# Patient Record
Sex: Male | Born: 1960 | Race: White | Hispanic: No | Smoking: Former smoker
Health system: Southern US, Community
[De-identification: ages and names within clinical notes are randomized; demographics above are authoritative.]

---

## 1999-03-21 ENCOUNTER — Emergency Department (HOSPITAL_COMMUNITY): Admission: EM | Admit: 1999-03-21 | Discharge: 1999-03-22 | Payer: Self-pay | Admitting: Emergency Medicine

## 2013-11-27 ENCOUNTER — Emergency Department (HOSPITAL_COMMUNITY): Payer: BC Managed Care – PPO

## 2013-11-27 ENCOUNTER — Inpatient Hospital Stay (HOSPITAL_COMMUNITY)
Admission: EM | Admit: 2013-11-27 | Discharge: 2013-12-05 | DRG: 460 | Disposition: A | Discharge status: Home / Self Care | Payer: BC Managed Care – PPO | Attending: Neurosurgery | Admitting: Neurosurgery

## 2013-11-27 ENCOUNTER — Encounter (HOSPITAL_COMMUNITY): Payer: Self-pay | Admitting: Radiology

## 2013-11-27 DIAGNOSIS — R339 Retention of urine, unspecified: Secondary | ICD-10-CM | POA: Diagnosis not present

## 2013-11-27 DIAGNOSIS — S32001A Stable burst fracture of unspecified lumbar vertebra, initial encounter for closed fracture: Secondary | ICD-10-CM

## 2013-11-27 DIAGNOSIS — W1789XA Other fall from one level to another, initial encounter: Secondary | ICD-10-CM | POA: Diagnosis present

## 2013-11-27 DIAGNOSIS — K5900 Constipation, unspecified: Secondary | ICD-10-CM | POA: Diagnosis not present

## 2013-11-27 DIAGNOSIS — T148XXA Other injury of unspecified body region, initial encounter: Secondary | ICD-10-CM

## 2013-11-27 DIAGNOSIS — G9611 Dural tear: Secondary | ICD-10-CM | POA: Diagnosis present

## 2013-11-27 DIAGNOSIS — S32009A Unspecified fracture of unspecified lumbar vertebra, initial encounter for closed fracture: Secondary | ICD-10-CM

## 2013-11-27 DIAGNOSIS — S32000S Wedge compression fracture of unspecified lumbar vertebra, sequela: Secondary | ICD-10-CM

## 2013-11-27 DIAGNOSIS — W19XXXA Unspecified fall, initial encounter: Secondary | ICD-10-CM

## 2013-11-27 DIAGNOSIS — M48061 Spinal stenosis, lumbar region without neurogenic claudication: Secondary | ICD-10-CM | POA: Diagnosis present

## 2013-11-27 LAB — CDS SEROLOGY

## 2013-11-27 LAB — I-STAT CHEM 8, ED
BUN: 24 mg/dL — ABNORMAL HIGH (ref 6–23)
Calcium, Ion: 1.16 mmol/L (ref 1.12–1.23)
Chloride: 109 mEq/L (ref 96–112)
Creatinine, Ser: 1 mg/dL (ref 0.50–1.35)
GLUCOSE: 110 mg/dL — AB (ref 70–99)
HCT: 46 % (ref 39.0–52.0)
HEMOGLOBIN: 15.6 g/dL (ref 13.0–17.0)
Potassium: 3.6 mEq/L — ABNORMAL LOW (ref 3.7–5.3)
Sodium: 141 mEq/L (ref 137–147)
TCO2: 22 mmol/L (ref 0–100)

## 2013-11-27 LAB — CBC
HCT: 41.7 % (ref 39.0–52.0)
Hemoglobin: 14.8 g/dL (ref 13.0–17.0)
MCH: 32.3 pg (ref 26.0–34.0)
MCHC: 35.5 g/dL (ref 30.0–36.0)
MCV: 91 fL (ref 78.0–100.0)
PLATELETS: 181 10*3/uL (ref 150–400)
RBC: 4.58 MIL/uL (ref 4.22–5.81)
RDW: 12.4 % (ref 11.5–15.5)
WBC: 14.8 10*3/uL — AB (ref 4.0–10.5)

## 2013-11-27 LAB — SAMPLE TO BLOOD BANK

## 2013-11-27 LAB — COMPREHENSIVE METABOLIC PANEL
ALBUMIN: 3.9 g/dL (ref 3.5–5.2)
ALT: 25 U/L (ref 0–53)
ANION GAP: 15 (ref 5–15)
AST: 28 U/L (ref 0–37)
Alkaline Phosphatase: 60 U/L (ref 39–117)
BILIRUBIN TOTAL: 0.7 mg/dL (ref 0.3–1.2)
BUN: 23 mg/dL (ref 6–23)
CHLORIDE: 107 meq/L (ref 96–112)
CO2: 20 mEq/L (ref 19–32)
CREATININE: 0.97 mg/dL (ref 0.50–1.35)
Calcium: 9.4 mg/dL (ref 8.4–10.5)
GLUCOSE: 110 mg/dL — AB (ref 70–99)
Potassium: 3.8 mEq/L (ref 3.7–5.3)
Sodium: 142 mEq/L (ref 137–147)
Total Protein: 7 g/dL (ref 6.0–8.3)

## 2013-11-27 LAB — PROTIME-INR
INR: 1.02 (ref 0.00–1.49)
PROTHROMBIN TIME: 13.4 s (ref 11.6–15.2)

## 2013-11-27 LAB — ETHANOL

## 2013-11-27 MED ORDER — ACETAMINOPHEN 325 MG PO TABS: 650.0000 mg | ORAL_TABLET | ORAL | Status: DC | PRN

## 2013-11-27 MED ORDER — HYDROMORPHONE HCL PF 1 MG/ML IJ SOLN
1.0000 mg | Freq: Once | INTRAMUSCULAR | Status: AC
  Administered 2013-11-27: 1 mg via INTRAVENOUS
  Filled 2013-11-27: qty 1

## 2013-11-27 MED ORDER — FENTANYL CITRATE 0.05 MG/ML IJ SOLN
INTRAMUSCULAR | Status: AC
  Filled 2013-11-27: qty 2

## 2013-11-27 MED ORDER — ONDANSETRON HCL 4 MG/2ML IJ SOLN
4.0000 mg | Freq: Once | INTRAMUSCULAR | Status: AC
  Administered 2013-11-27: 4 mg via INTRAVENOUS
  Filled 2013-11-27: qty 2

## 2013-11-27 MED ORDER — FENTANYL CITRATE 0.05 MG/ML IJ SOLN
50.0000 ug | Freq: Once | INTRAMUSCULAR | Status: AC
  Administered 2013-11-27: 50 ug via INTRAVENOUS

## 2013-11-27 MED ORDER — OXYCODONE HCL 5 MG PO TABS
10.0000 mg | ORAL_TABLET | ORAL | Status: DC | PRN
  Administered 2013-11-28 (×3): 10 mg via ORAL
  Filled 2013-11-27 (×3): qty 2

## 2013-11-27 MED ORDER — MORPHINE SULFATE 4 MG/ML IJ SOLN
6.0000 mg | INTRAMUSCULAR | Status: AC
  Administered 2013-11-27: 6 mg via INTRAVENOUS
  Filled 2013-11-27: qty 2

## 2013-11-27 MED ORDER — IOHEXOL 300 MG/ML  SOLN
100.0000 mL | Freq: Once | INTRAMUSCULAR | Status: AC | PRN
  Administered 2013-11-27: 100 mL via INTRAVENOUS

## 2013-11-27 MED ORDER — ONDANSETRON HCL 4 MG/2ML IJ SOLN
4.0000 mg | Freq: Four times a day (QID) | INTRAMUSCULAR | Status: DC | PRN
Start: 2013-11-27 — End: 2013-11-29

## 2013-11-27 MED ORDER — MORPHINE SULFATE 2 MG/ML IJ SOLN
2.0000 mg | INTRAMUSCULAR | Status: DC | PRN
  Administered 2013-11-28 (×2): 2 mg via INTRAVENOUS
  Administered 2013-11-28 (×2): 4 mg via INTRAVENOUS
  Filled 2013-11-27: qty 1
  Filled 2013-11-27 (×2): qty 2
  Filled 2013-11-27: qty 1
  Filled 2013-11-27: qty 2

## 2013-11-27 NOTE — ED Provider Notes (Signed)
Patient seen/examined in the Emergency Department in conjunction with Resident Physician Provider stengel Patient reports back pain s/p fall Exam : awake/alert, tenderness to right LE.  Pelvis appears stable Plan: imaging/labs ordered   Joya Gaskins, MD 11/27/13 [phone]

## 2013-11-27 NOTE — ED Notes (Signed)
Pt back from CT

## 2013-11-27 NOTE — ED Provider Notes (Signed)
CSN: [id number]     Arrival date & time 11/27/13  1949 History   First MD Initiated Contact with Patient 11/27/13 1951     CC: Fall, trauma   Patient is a 53 y.o. male presenting with fall and trauma. The history is provided by the patient and the EMS personnel.  Fall This is a new problem. The current episode started today. Associated symptoms include arthralgias and joint swelling (r ankle). Pertinent negatives include no abdominal pain, chest pain, chills, coughing, fever, headaches, nausea, neck pain, numbness, rash, visual change, vomiting or weakness.  Trauma Mechanism of injury: fall   Fall:      Fall occurred: tree.      Height of fall: 16 feet      Impact surface: dirt      Point of impact: feet      Entrapped after fall: no  Protective equipment:       None      Suspicion of alcohol use: no      Suspicion of drug use: no  EMS/PTA data:      Bystander interventions: bystander C-spine precautions      Ambulatory at scene: no      Blood loss: none      Responsiveness: alert      Oriented to: person, place, situation and time      Loss of consciousness: no      Amnesic to event: no      Airway interventions: none      IV access: established      Immobilization: C-collar and long board  Current symptoms:      Pain scale: 8/10      Pain timing: constant      Associated symptoms:            Reports back pain.            Denies abdominal pain, chest pain, headache, loss of consciousness, nausea, neck pain and vomiting.   Fell from 16 ft onto both feet with pain to right ankle. After fall immediately became incontinent.  Unable to ambulate due to pain.  Did not strike head, no LOC, no neck pain.  No other extrmity injury.  Denies CP, dyspnea.    History reviewed. No pertinent past medical history. No past surgical history on file. No family history on file. History  Substance Use Topics  . Smoking status: Not on file  . Smokeless tobacco: Not on file  . Alcohol  Use: Not on file    Review of Systems  Constitutional: Negative for fever and chills.  HENT: Negative for facial swelling.   Eyes: Negative for visual disturbance.  Respiratory: Negative for cough, shortness of breath and wheezing.   Cardiovascular: Negative for chest pain.  Gastrointestinal: Negative for nausea, vomiting and abdominal pain.  Musculoskeletal: Positive for arthralgias, back pain and joint swelling (r ankle). Negative for neck pain.  Skin: Negative for rash and wound.  Neurological: Negative for dizziness, loss of consciousness, syncope, weakness, numbness and headaches.  All other systems reviewed and are negative.     Allergies  Review of patient's allergies indicates no known allergies.  Home Medications   Prior to Admission medications   Not on File   BP 152/97  Pulse 93  Temp(Src) 99.1 F (37.3 C) (Oral)  Resp 19  SpO2 94% Physical Exam  Nursing note and vitals reviewed. Constitutional: He is oriented to person, place, and time. He appears well-developed and well-nourished.  Pain  to back   HENT:  Head: Normocephalic and atraumatic.  Nose: Nose normal.  Eyes: Conjunctivae are normal.  Neck: Normal range of motion. Neck supple. No tracheal deviation present.  collared  Cardiovascular: Normal rate, regular rhythm and normal heart sounds.   No murmur heard. Intact pulses  Pulmonary/Chest: Effort normal and breath sounds normal. No respiratory distress. He has no wheezes. He has no rales. He exhibits no tenderness.  Abdominal: Soft. Bowel sounds are normal. He exhibits no distension and no mass. There is tenderness (suprapubic). There is no rebound and no guarding.  Genitourinary: Rectum normal.  Normal rectal tone, no gross blood   Musculoskeletal: Normal range of motion.  R ankle edema and TTP Back: Lumbar midline TTP. No step off Otherwise, extremities without injury  Neurological: He is alert and oriented to person, place, and time.  Normal  strength of extremities, no sensory loss. Normal perineal sensation  Skin: Skin is warm and dry. No rash noted.  No wounds  Psychiatric: He has a normal mood and affect.    ED Course  Procedures (including critical care time) Labs Review Labs Reviewed  COMPREHENSIVE METABOLIC PANEL - Abnormal; Notable for the following:    Glucose, Bld 110 (*)    All other components within normal limits  CBC - Abnormal; Notable for the following:    WBC 14.8 (*)    All other components within normal limits  I-STAT CHEM 8, ED - Abnormal; Notable for the following:    Potassium 3.6 (*)    BUN 24 (*)    Glucose, Bld 110 (*)    All other components within normal limits  CDS SEROLOGY  ETHANOL  PROTIME-INR  URINALYSIS, ROUTINE W REFLEX MICROSCOPIC  SAMPLE TO BLOOD BANK    Imaging Review Dg Tibia/fibula Right  11/27/2013   CLINICAL DATA:  Fall 16 feet out of a tree, right leg swelling  EXAM: RIGHT TIBIA AND FIBULA - 2 VIEW  COMPARISON:  None.  FINDINGS: Distal tibia and fibula are excluded.  No fracture or dislocation is seen.  The visualized soft tissues are unremarkable.  IMPRESSION: No fracture or dislocation is seen.   Electronically Signed   By: Charline Bills M.D.   On: 11/27/2013 21:37   Dg Ankle Complete Right  11/27/2013   CLINICAL DATA:  Patient fell 16 feet out of a tree. Right ankle pain and swelling.  EXAM: RIGHT ANKLE - COMPLETE 3+ VIEW  COMPARISON:  None.  FINDINGS: Prominent lateral soft tissue swelling over the right ankle. No evidence of acute fracture or dislocation. No focal bone lesion or bone destruction. Bone cortex and trabecular architecture appear intact. Plantar calcaneal spur is incidentally noted.  IMPRESSION: Lateral soft tissue swelling. No acute bony abnormalities suggested appear   Electronically Signed   By: Burman Nieves M.D.   On: 11/27/2013 21:37   Ct Head Wo Contrast  11/27/2013   CLINICAL DATA:  Fall from tree stand  EXAM: CT HEAD WITHOUT CONTRAST  CT  CERVICAL SPINE WITHOUT CONTRAST  TECHNIQUE: Multidetector CT imaging of the head and cervical spine was performed following the standard protocol without intravenous contrast. Multiplanar CT image reconstructions of the cervical spine were also generated.  COMPARISON:  None.  FINDINGS: CT HEAD FINDINGS  No evidence of parenchymal hemorrhage or extra-axial fluid collection. No mass lesion, mass effect, or midline shift.  No CT evidence of acute infarction.  Cerebral volume is within normal limits.  No ventriculomegaly.  Mucosal thickening in the bilateral ethmoid  and maxillary sinuses. Partial opacification of the left sphenoid sinus. The mastoid air cells are clear.  No evidence of calvarial fracture.  CT CERVICAL SPINE FINDINGS  Reversal the normal cervical lordosis.  No evidence of fracture or dislocation. Vertebral body heights are maintained. Dens appears intact.  No prevertebral soft tissue swelling.  Moderate multilevel degenerative changes.  Visualized thyroid is unremarkable.  IMPRESSION: No evidence of acute intracranial abnormality.  No evidence of traumatic injury to the cervical spine. Moderate multilevel degenerative changes.   Electronically Signed   By: Charline Bills M.D.   On: 11/27/2013 20:47   Ct Chest W Contrast  11/27/2013   CLINICAL DATA:  Trauma  EXAM: CT CHEST, ABDOMEN, AND PELVIS WITH CONTRAST  TECHNIQUE: Multidetector CT imaging of the chest, abdomen and pelvis was performed following the standard protocol during bolus administration of intravenous contrast.  CONTRAST:  OMNIPAQUE IOHEXOL 300 MG/ML  SOLN  COMPARISON:  Prior radiographs from earlier the same day  FINDINGS: CT CHEST FINDINGS  Thyroid gland within normal limits.  No mediastinal, hilar, or axillary adenopathy.  Intrathoracic aorta is of normal caliber without evidence of acute traumatic injury. Great vessels are intact. No mediastinal hematoma.  Heart size is normal.  Pulmonary arteries within normal limits.  No  pneumothorax or pulmonary contusion. Subsegmental atelectasis seen dependently within the bilateral lower lobes. No focal infiltrates. No worrisome pulmonary nodule or mass.  No acute fracture within the thorax.  CT ABDOMEN AND PELVIS FINDINGS  The liver is intact. Gallbladder is normal. Spleen is intact. Adrenal glands within normal limits. Pancreas is unremarkable. Kidneys demonstrate a normal contrast enhanced appearance without evidence of acute abnormality. Single 3 mm nonobstructive left renal calculus noted.  Stomach is normal. No evidence of bowel obstruction or acute bowel injury. No acute inflammatory changes seen about the bowels. Few scattered colonic diverticula noted. Fat containing paraumbilical hernia noted.  Bladder prostate within normal limits.  No free intraperitoneal air.  The intra abdominal aorta is and its branch vessels are intact. Soft tissue stranding tracking adjacent to the left aspect of the infrarenal aorta is thought to be secondary to adjacent psoas hematoma.  No acute pelvic fracture.  There is an acute comminuted fracture through the anterior superior endplate of L1 with associated 25-30% of height loss. No significant retropulsion. Faint linear lucencies suggestive of nondisplaced fractures extending through the bilateral pedicles are suspected. No significant displacement.  There is a severe burst type fracture of the L3 vertebral body with approximately 60-70% of height loss. There is approximately 1.2 cm of retropulsion with resultant severe canal stenosis. The thecal sac measures approximately 3 mm in AP diameter. There is disruption of both the anterior and posterior columns. There is disruption of the posterior column as well as there is an acute nondisplaced fracture of the right lamina of L3. There are is an associated fracture of the right transverse process of L3.  There is an associated hematoma within the left psoas musculature.  Curvilinear osseous density along the  left lateral aspect of the L4 superior endplate is suspicious for a fractured osteophyte (series 3, image 81). The L4 superior endplate itself appears intact. There is question of additional small fractures through endplate osteophytes at the inferior endplate of L2 bilaterally.  IMPRESSION: 1. Acute burst type fracture involving the L3 vertebral body with associated 1.2 cm of retropulsion and resultant severe central canal stenosis. There is disruption of all 3 columns, consistent with an unstable fracture. 2. Acute compression  fracture of L1 with associated 25-30% of height loss without retropulsion. 3. Left psoas hematoma. 4. Acute fracture through the right transverse process of L3. 5. Question fractured osteophyte adjacent to the left lateral aspect of the L4 superior endplate. 6. No other acute traumatic injury within the chest, abdomen, and pelvis. 7. 3 mm nonobstructive left renal calculus.  Critical Value/emergent results were called by telephone at the time of interpretation on 11/27/2013 at 8:49 pm to Dr. Zadie Rhine , who verbally acknowledged these results.   Electronically Signed   By: Rise Mu M.D.   On: 11/27/2013 21:17   Ct Cervical Spine Wo Contrast  11/27/2013   CLINICAL DATA:  Fall from tree stand  EXAM: CT HEAD WITHOUT CONTRAST  CT CERVICAL SPINE WITHOUT CONTRAST  TECHNIQUE: Multidetector CT imaging of the head and cervical spine was performed following the standard protocol without intravenous contrast. Multiplanar CT image reconstructions of the cervical spine were also generated.  COMPARISON:  None.  FINDINGS: CT HEAD FINDINGS  No evidence of parenchymal hemorrhage or extra-axial fluid collection. No mass lesion, mass effect, or midline shift.  No CT evidence of acute infarction.  Cerebral volume is within normal limits.  No ventriculomegaly.  Mucosal thickening in the bilateral ethmoid and maxillary sinuses. Partial opacification of the left sphenoid sinus. The mastoid air  cells are clear.  No evidence of calvarial fracture.  CT CERVICAL SPINE FINDINGS  Reversal the normal cervical lordosis.  No evidence of fracture or dislocation. Vertebral body heights are maintained. Dens appears intact.  No prevertebral soft tissue swelling.  Moderate multilevel degenerative changes.  Visualized thyroid is unremarkable.  IMPRESSION: No evidence of acute intracranial abnormality.  No evidence of traumatic injury to the cervical spine. Moderate multilevel degenerative changes.   Electronically Signed   By: Charline Bills M.D.   On: 11/27/2013 20:47   Ct Abdomen Pelvis W Contrast  11/27/2013   CLINICAL DATA:  Trauma  EXAM: CT CHEST, ABDOMEN, AND PELVIS WITH CONTRAST  TECHNIQUE: Multidetector CT imaging of the chest, abdomen and pelvis was performed following the standard protocol during bolus administration of intravenous contrast.  CONTRAST:  OMNIPAQUE IOHEXOL 300 MG/ML  SOLN  COMPARISON:  Prior radiographs from earlier the same day  FINDINGS: CT CHEST FINDINGS  Thyroid gland within normal limits.  No mediastinal, hilar, or axillary adenopathy.  Intrathoracic aorta is of normal caliber without evidence of acute traumatic injury. Great vessels are intact. No mediastinal hematoma.  Heart size is normal.  Pulmonary arteries within normal limits.  No pneumothorax or pulmonary contusion. Subsegmental atelectasis seen dependently within the bilateral lower lobes. No focal infiltrates. No worrisome pulmonary nodule or mass.  No acute fracture within the thorax.  CT ABDOMEN AND PELVIS FINDINGS  The liver is intact. Gallbladder is normal. Spleen is intact. Adrenal glands within normal limits. Pancreas is unremarkable. Kidneys demonstrate a normal contrast enhanced appearance without evidence of acute abnormality. Single 3 mm nonobstructive left renal calculus noted.  Stomach is normal. No evidence of bowel obstruction or acute bowel injury. No acute inflammatory changes seen about the bowels. Few  scattered colonic diverticula noted. Fat containing paraumbilical hernia noted.  Bladder prostate within normal limits.  No free intraperitoneal air.  The intra abdominal aorta is and its branch vessels are intact. Soft tissue stranding tracking adjacent to the left aspect of the infrarenal aorta is thought to be secondary to adjacent psoas hematoma.  No acute pelvic fracture.  There is an acute comminuted fracture through  the anterior superior endplate of L1 with associated 25-30% of height loss. No significant retropulsion. Faint linear lucencies suggestive of nondisplaced fractures extending through the bilateral pedicles are suspected. No significant displacement.  There is a severe burst type fracture of the L3 vertebral body with approximately 60-70% of height loss. There is approximately 1.2 cm of retropulsion with resultant severe canal stenosis. The thecal sac measures approximately 3 mm in AP diameter. There is disruption of both the anterior and posterior columns. There is disruption of the posterior column as well as there is an acute nondisplaced fracture of the right lamina of L3. There are is an associated fracture of the right transverse process of L3.  There is an associated hematoma within the left psoas musculature.  Curvilinear osseous density along the left lateral aspect of the L4 superior endplate is suspicious for a fractured osteophyte (series 3, image 81). The L4 superior endplate itself appears intact. There is question of additional small fractures through endplate osteophytes at the inferior endplate of L2 bilaterally.  IMPRESSION: 1. Acute burst type fracture involving the L3 vertebral body with associated 1.2 cm of retropulsion and resultant severe central canal stenosis. There is disruption of all 3 columns, consistent with an unstable fracture. 2. Acute compression fracture of L1 with associated 25-30% of height loss without retropulsion. 3. Left psoas hematoma. 4. Acute fracture  through the right transverse process of L3. 5. Question fractured osteophyte adjacent to the left lateral aspect of the L4 superior endplate. 6. No other acute traumatic injury within the chest, abdomen, and pelvis. 7. 3 mm nonobstructive left renal calculus.  Critical Value/emergent results were called by telephone at the time of interpretation on 11/27/2013 at 8:49 pm to Dr. Zadie Rhine , who verbally acknowledged these results.   Electronically Signed   By: Rise Mu M.D.   On: 11/27/2013 21:17   Dg Pelvis Portable  11/27/2013   CLINICAL DATA:  Fall  EXAM: PORTABLE PELVIS 1-2 VIEWS  COMPARISON:  None.  FINDINGS: No fracture or dislocation is seen.  Bilateral hip joints are preserved.  Visualized bony pelvis appears intact.  IMPRESSION: No fracture or dislocation is seen.   Electronically Signed   By: Charline Bills M.D.   On: 11/27/2013 20:24   Dg Chest Port 1 View  11/27/2013   CLINICAL DATA:  Larey Seat out of a tree stand landing feet first, low back and RIGHT ankle pain  EXAM: PORTABLE CHEST - 1 VIEW  COMPARISON:  Portable exam 2000 hr without priors for comparison.  FINDINGS: Low lung volumes.  Enlargement of cardiac silhouette and prominent mediastinum potentially accentuated by AP portable supine technique.  Pulmonary vascular congestion.  RIGHT basilar atelectasis and accentuation of perihilar markings.  No gross infiltrate, pleural effusion, pneumothorax, or fracture.  IMPRESSION: Low lung volumes with RIGHT basilar atelectasis and accentuated perihilar markings.  Prominent heart and mediastinum may be related to technique ; consider followup upright PA and lateral chest radiographs to better assess mediastinum.   Electronically Signed   By: Ulyses Southward M.D.   On: 11/27/2013 20:25   Dg Foot Complete Right  11/27/2013   CLINICAL DATA:  Larey Seat 16 feet out of a tree, RIGHT leg swelling at ankle  EXAM: RIGHT FOOT COMPLETE - 3+ VIEW  COMPARISON:  Portable exam 2118 hr without priors for  comparison  FINDINGS: Exam slightly limited by positioning and portable technique.  Osseous mineralization grossly normal.  Joint spaces preserved.  Plantar calcaneal spur.  Soft tissue swelling  lower leg.  No definite acute fracture, dislocation or bone destruction identified.  IMPRESSION: No definite acute osseous abnormalities within limitations as above.   Electronically Signed   By: Ulyses Southward M.D.   On: 11/27/2013 21:40     EKG Interpretation   Date/Time:  Thursday November 27 2013 19:53:14 EDT Ventricular Rate:  65 PR Interval:  168 QRS Duration: 95 QT Interval:  387 QTC Calculation: 402 R Axis:   43 Text Interpretation:  Sinus rhythm Probable left atrial enlargement No  previous ECGs available Confirmed by Bebe Shaggy  MD, Dorinda Hill ([id number]) on  11/27/2013 7:59:44 PM      MDM   Final diagnoses:  Lumbar burst fracture, closed, initial encounter  Lumbar compression fracture, sequela  Lumbar transverse process fracture, closed, initial encounter  Hematoma of muscle  Fall, initial encounter    ABCs intact, VSS.  AAOx4.  Immobilized in field.  Access obtained. GCS 15. R ankle edema, XRs negative.  CT trauma scans ordered given mechanism.  Demonstrate L3 burst fracture with retropulsion among other lumbar TP/cpxn fractures.  Left psaos hematoma found. Normal rectal tone. No obvious neuro deficit distally. Rest of imaging negative. Abd without peritonitis. NSU consulted. Pending recs, but will admit.    Sofie Rower, MD 11/28/13 [phone]

## 2013-11-27 NOTE — Progress Notes (Signed)
Chaplain responded to level 2 trauma of a fall from a tree stand. The patient was alert at the time of arrival to the ED. Chaplain was unable at time of arrival to see patient because of medical intervention.  Follow up with patient who shares he was preparing for hunting season when he fell out of a tree stand of 16 feet tree. He reports he had some injuries that will prevent him from hunting this season. Chaplain provided a listening presence for patient to share his thoughts and disappointment of missing season of hunting. He was appreciative of support, , Chaplain informed patients family of his medical testing and explained their having to wait to visit patient.  Follow-up pastoral support will be on-going for encouragement. Chaplain Janell Quiet [phone]

## 2013-11-27 NOTE — Consult Note (Signed)
Reason for Consult: L1 and L3 fractures Referring Physician: Dr. Rosalene Billings is an 53 y.o. male.  HPI: The patient is a 53 year old white male who was in his usual state of good health until this evening at around 1900. He was up in approximately 15 foot tree stand. The patient began to climb down and the railing gave way. He more or less landed on his feet. He had the immediate onset of back pain. He urinated on himself. Patient's cell phone was in his truck about 50 yards away and he crawled to his truck because he could not stand. He called his son and EMS was transported to [facility]. He was evaluated by Dr. Christy Gentles. Evaluation included a CT of the chest abdomen and pelvis which demonstrated a mild L1 fracture has severe L3 burst fracture. A neurosurgical consultation was requested.  Presently the patient is alert and pleasant. He denies neck pain, loss of consciousness, he does not note any weakness in his lower extremities. He does admit to some perineal numbness. He complains of lumbar pain.  History reviewed. No pertinent past medical history.  No past surgical history on file.  No family history on file.  Social History:  has no tobacco, alcohol, and drug history on file.  Allergies: No Known Allergies  Medications:  I have reviewed the patient's current medications. Prior to Admission:  (Not in a hospital admission) Scheduled: Continuous: PRN:   Anti-infectives   None       Results for orders placed during the hospital encounter of 11/27/13 (from the past 48 hour(s))  CDS SEROLOGY     Status: None   Collection Time    11/27/13  8:00 PM      Result Value Ref Range   CDS serology specimen       Value: SPECIMEN WILL BE HELD FOR 14 DAYS IF TESTING IS REQUIRED  COMPREHENSIVE METABOLIC PANEL     Status: Abnormal   Collection Time    11/27/13  8:00 PM      Result Value Ref Range   Sodium 142  137 - 147 mEq/L   Potassium 3.8  3.7 - 5.3 mEq/L    Chloride 107  96 - 112 mEq/L   CO2 20  19 - 32 mEq/L   Glucose, Bld 110 (*) 70 - 99 mg/dL   BUN 23  6 - 23 mg/dL   Creatinine, Ser 0.97  0.50 - 1.35 mg/dL   Calcium 9.4  8.4 - 10.5 mg/dL   Total Protein 7.0  6.0 - 8.3 g/dL   Albumin 3.9  3.5 - 5.2 g/dL   AST 28  0 - 37 U/L   ALT 25  0 - 53 U/L   Alkaline Phosphatase 60  39 - 117 U/L   Total Bilirubin 0.7  0.3 - 1.2 mg/dL   GFR calc non Af Amer >90  >90 mL/min   GFR calc Af Amer >90  >90 mL/min   Comment: (NOTE)     The eGFR has been calculated using the CKD EPI equation.     This calculation has not been validated in all clinical situations.     eGFR's persistently <90 mL/min signify possible Chronic Kidney     Disease.   Anion gap 15  5 - 15  CBC     Status: Abnormal   Collection Time    11/27/13  8:00 PM      Result Value Ref Range   WBC 14.8 (*)  4.0 - 10.5 K/uL   RBC 4.58  4.22 - 5.81 MIL/uL   Hemoglobin 14.8  13.0 - 17.0 g/dL   HCT 41.7  39.0 - 52.0 %   MCV 91.0  78.0 - 100.0 fL   MCH 32.3  26.0 - 34.0 pg   MCHC 35.5  30.0 - 36.0 g/dL   RDW 12.4  11.5 - 15.5 %   Platelets 181  150 - 400 K/uL  ETHANOL     Status: None   Collection Time    11/27/13  8:00 PM      Result Value Ref Range   Alcohol, Ethyl (B) <11  0 - 11 mg/dL   Comment:            LOWEST DETECTABLE LIMIT FOR     SERUM ALCOHOL IS 11 mg/dL     FOR MEDICAL PURPOSES ONLY  PROTIME-INR     Status: None   Collection Time    11/27/13  8:00 PM      Result Value Ref Range   Prothrombin Time 13.4  11.6 - 15.2 seconds   INR 1.02  0.00 - 1.49  SAMPLE TO BLOOD BANK     Status: None   Collection Time    11/27/13  8:00 PM      Result Value Ref Range   Blood Bank Specimen SAMPLE AVAILABLE FOR TESTING     Sample Expiration 11/28/2013    I-STAT CHEM 8, ED     Status: Abnormal   Collection Time    11/27/13  8:16 PM      Result Value Ref Range   Sodium 141  137 - 147 mEq/L   Potassium 3.6 (*) 3.7 - 5.3 mEq/L   Chloride 109  96 - 112 mEq/L   BUN 24 (*) 6 - 23  mg/dL   Creatinine, Ser 1.00  0.50 - 1.35 mg/dL   Glucose, Bld 110 (*) 70 - 99 mg/dL   Calcium, Ion 1.16  1.12 - 1.23 mmol/L   TCO2 22  0 - 100 mmol/L   Hemoglobin 15.6  13.0 - 17.0 g/dL   HCT 46.0  39.0 - 52.0 %    Dg Tibia/fibula Right  11/27/2013   CLINICAL DATA:  Fall 16 feet out of a tree, right leg swelling  EXAM: RIGHT TIBIA AND FIBULA - 2 VIEW  COMPARISON:  None.  FINDINGS: Distal tibia and fibula are excluded.  No fracture or dislocation is seen.  The visualized soft tissues are unremarkable.  IMPRESSION: No fracture or dislocation is seen.   Electronically Signed   By: Julian Hy M.D.   On: 11/27/2013 21:37   Dg Ankle Complete Right  11/27/2013   CLINICAL DATA:  Patient fell 16 feet out of a tree. Right ankle pain and swelling.  EXAM: RIGHT ANKLE - COMPLETE 3+ VIEW  COMPARISON:  None.  FINDINGS: Prominent lateral soft tissue swelling over the right ankle. No evidence of acute fracture or dislocation. No focal bone lesion or bone destruction. Bone cortex and trabecular architecture appear intact. Plantar calcaneal spur is incidentally noted.  IMPRESSION: Lateral soft tissue swelling. No acute bony abnormalities suggested appear   Electronically Signed   By: Lucienne Capers M.D.   On: 11/27/2013 21:37   Ct Head Wo Contrast  11/27/2013   CLINICAL DATA:  Fall from tree stand  EXAM: CT HEAD WITHOUT CONTRAST  CT CERVICAL SPINE WITHOUT CONTRAST  TECHNIQUE: Multidetector CT imaging of the head and cervical spine was performed following the  standard protocol without intravenous contrast. Multiplanar CT image reconstructions of the cervical spine were also generated.  COMPARISON:  None.  FINDINGS: CT HEAD FINDINGS  No evidence of parenchymal hemorrhage or extra-axial fluid collection. No mass lesion, mass effect, or midline shift.  No CT evidence of acute infarction.  Cerebral volume is within normal limits.  No ventriculomegaly.  Mucosal thickening in the bilateral ethmoid and maxillary  sinuses. Partial opacification of the left sphenoid sinus. The mastoid air cells are clear.  No evidence of calvarial fracture.  CT CERVICAL SPINE FINDINGS  Reversal the normal cervical lordosis.  No evidence of fracture or dislocation. Vertebral body heights are maintained. Dens appears intact.  No prevertebral soft tissue swelling.  Moderate multilevel degenerative changes.  Visualized thyroid is unremarkable.  IMPRESSION: No evidence of acute intracranial abnormality.  No evidence of traumatic injury to the cervical spine. Moderate multilevel degenerative changes.   Electronically Signed   By: Julian Hy M.D.   On: 11/27/2013 20:47   Ct Chest W Contrast  11/27/2013   CLINICAL DATA:  Trauma  EXAM: CT CHEST, ABDOMEN, AND PELVIS WITH CONTRAST  TECHNIQUE: Multidetector CT imaging of the chest, abdomen and pelvis was performed following the standard protocol during bolus administration of intravenous contrast.  CONTRAST:  132m OMNIPAQUE IOHEXOL 300 MG/ML  SOLN  COMPARISON:  Prior radiographs from earlier the same day  FINDINGS: CT CHEST FINDINGS  Thyroid gland within normal limits.  No mediastinal, hilar, or axillary adenopathy.  Intrathoracic aorta is of normal caliber without evidence of acute traumatic injury. Great vessels are intact. No mediastinal hematoma.  Heart size is normal.  Pulmonary arteries within normal limits.  No pneumothorax or pulmonary contusion. Subsegmental atelectasis seen dependently within the bilateral lower lobes. No focal infiltrates. No worrisome pulmonary nodule or mass.  No acute fracture within the thorax.  CT ABDOMEN AND PELVIS FINDINGS  The liver is intact. Gallbladder is normal. Spleen is intact. Adrenal glands within normal limits. Pancreas is unremarkable. Kidneys demonstrate a normal contrast enhanced appearance without evidence of acute abnormality. Single 3 mm nonobstructive left renal calculus noted.  Stomach is normal. No evidence of bowel obstruction or acute  bowel injury. No acute inflammatory changes seen about the bowels. Few scattered colonic diverticula noted. Fat containing paraumbilical hernia noted.  Bladder prostate within normal limits.  No free intraperitoneal air.  The intra abdominal aorta is and its branch vessels are intact. Soft tissue stranding tracking adjacent to the left aspect of the infrarenal aorta is thought to be secondary to adjacent psoas hematoma.  No acute pelvic fracture.  There is an acute comminuted fracture through the anterior superior endplate of L1 with associated 25-30% of height loss. No significant retropulsion. Faint linear lucencies suggestive of nondisplaced fractures extending through the bilateral pedicles are suspected. No significant displacement.  There is a severe burst type fracture of the L3 vertebral body with approximately 60-70% of height loss. There is approximately 1.2 cm of retropulsion with resultant severe canal stenosis. The thecal sac measures approximately 3 mm in AP diameter. There is disruption of both the anterior and posterior columns. There is disruption of the posterior column as well as there is an acute nondisplaced fracture of the right lamina of L3. There are is an associated fracture of the right transverse process of L3.  There is an associated hematoma within the left psoas musculature.  Curvilinear osseous density along the left lateral aspect of the L4 superior endplate is suspicious for a fractured osteophyte (  series 3, image 81). The L4 superior endplate itself appears intact. There is question of additional small fractures through endplate osteophytes at the inferior endplate of L2 bilaterally.  IMPRESSION: 1. Acute burst type fracture involving the L3 vertebral body with associated 1.2 cm of retropulsion and resultant severe central canal stenosis. There is disruption of all 3 columns, consistent with an unstable fracture. 2. Acute compression fracture of L1 with associated 25-30% of height  loss without retropulsion. 3. Left psoas hematoma. 4. Acute fracture through the right transverse process of L3. 5. Question fractured osteophyte adjacent to the left lateral aspect of the L4 superior endplate. 6. No other acute traumatic injury within the chest, abdomen, and pelvis. 7. 3 mm nonobstructive left renal calculus.  Critical Value/emergent results were called by telephone at the time of interpretation on 11/27/2013 at 8:49 pm to Dr. Ripley Fraise , who verbally acknowledged these results.   Electronically Signed   By: Jeannine Boga M.D.   On: 11/27/2013 21:17   Ct Cervical Spine Wo Contrast  11/27/2013   CLINICAL DATA:  Fall from tree stand  EXAM: CT HEAD WITHOUT CONTRAST  CT CERVICAL SPINE WITHOUT CONTRAST  TECHNIQUE: Multidetector CT imaging of the head and cervical spine was performed following the standard protocol without intravenous contrast. Multiplanar CT image reconstructions of the cervical spine were also generated.  COMPARISON:  None.  FINDINGS: CT HEAD FINDINGS  No evidence of parenchymal hemorrhage or extra-axial fluid collection. No mass lesion, mass effect, or midline shift.  No CT evidence of acute infarction.  Cerebral volume is within normal limits.  No ventriculomegaly.  Mucosal thickening in the bilateral ethmoid and maxillary sinuses. Partial opacification of the left sphenoid sinus. The mastoid air cells are clear.  No evidence of calvarial fracture.  CT CERVICAL SPINE FINDINGS  Reversal the normal cervical lordosis.  No evidence of fracture or dislocation. Vertebral body heights are maintained. Dens appears intact.  No prevertebral soft tissue swelling.  Moderate multilevel degenerative changes.  Visualized thyroid is unremarkable.  IMPRESSION: No evidence of acute intracranial abnormality.  No evidence of traumatic injury to the cervical spine. Moderate multilevel degenerative changes.   Electronically Signed   By: Julian Hy M.D.   On: 11/27/2013 20:47   Ct  Abdomen Pelvis W Contrast  11/27/2013   CLINICAL DATA:  Trauma  EXAM: CT CHEST, ABDOMEN, AND PELVIS WITH CONTRAST  TECHNIQUE: Multidetector CT imaging of the chest, abdomen and pelvis was performed following the standard protocol during bolus administration of intravenous contrast.  CONTRAST:  127mL OMNIPAQUE IOHEXOL 300 MG/ML  SOLN  COMPARISON:  Prior radiographs from earlier the same day  FINDINGS: CT CHEST FINDINGS  Thyroid gland within normal limits.  No mediastinal, hilar, or axillary adenopathy.  Intrathoracic aorta is of normal caliber without evidence of acute traumatic injury. Great vessels are intact. No mediastinal hematoma.  Heart size is normal.  Pulmonary arteries within normal limits.  No pneumothorax or pulmonary contusion. Subsegmental atelectasis seen dependently within the bilateral lower lobes. No focal infiltrates. No worrisome pulmonary nodule or mass.  No acute fracture within the thorax.  CT ABDOMEN AND PELVIS FINDINGS  The liver is intact. Gallbladder is normal. Spleen is intact. Adrenal glands within normal limits. Pancreas is unremarkable. Kidneys demonstrate a normal contrast enhanced appearance without evidence of acute abnormality. Single 3 mm nonobstructive left renal calculus noted.  Stomach is normal. No evidence of bowel obstruction or acute bowel injury. No acute inflammatory changes seen about the bowels. Few  scattered colonic diverticula noted. Fat containing paraumbilical hernia noted.  Bladder prostate within normal limits.  No free intraperitoneal air.  The intra abdominal aorta is and its branch vessels are intact. Soft tissue stranding tracking adjacent to the left aspect of the infrarenal aorta is thought to be secondary to adjacent psoas hematoma.  No acute pelvic fracture.  There is an acute comminuted fracture through the anterior superior endplate of L1 with associated 25-30% of height loss. No significant retropulsion. Faint linear lucencies suggestive of nondisplaced  fractures extending through the bilateral pedicles are suspected. No significant displacement.  There is a severe burst type fracture of the L3 vertebral body with approximately 60-70% of height loss. There is approximately 1.2 cm of retropulsion with resultant severe canal stenosis. The thecal sac measures approximately 3 mm in AP diameter. There is disruption of both the anterior and posterior columns. There is disruption of the posterior column as well as there is an acute nondisplaced fracture of the right lamina of L3. There are is an associated fracture of the right transverse process of L3.  There is an associated hematoma within the left psoas musculature.  Curvilinear osseous density along the left lateral aspect of the L4 superior endplate is suspicious for a fractured osteophyte (series 3, image 81). The L4 superior endplate itself appears intact. There is question of additional small fractures through endplate osteophytes at the inferior endplate of L2 bilaterally.  IMPRESSION: 1. Acute burst type fracture involving the L3 vertebral body with associated 1.2 cm of retropulsion and resultant severe central canal stenosis. There is disruption of all 3 columns, consistent with an unstable fracture. 2. Acute compression fracture of L1 with associated 25-30% of height loss without retropulsion. 3. Left psoas hematoma. 4. Acute fracture through the right transverse process of L3. 5. Question fractured osteophyte adjacent to the left lateral aspect of the L4 superior endplate. 6. No other acute traumatic injury within the chest, abdomen, and pelvis. 7. 3 mm nonobstructive left renal calculus.  Critical Value/emergent results were called by telephone at the time of interpretation on 11/27/2013 at 8:49 pm to Dr. Ripley Fraise , who verbally acknowledged these results.   Electronically Signed   By: Jeannine Boga M.D.   On: 11/27/2013 21:17   Dg Pelvis Portable  11/27/2013   CLINICAL DATA:  Fall  EXAM:  PORTABLE PELVIS 1-2 VIEWS  COMPARISON:  None.  FINDINGS: No fracture or dislocation is seen.  Bilateral hip joints are preserved.  Visualized bony pelvis appears intact.  IMPRESSION: No fracture or dislocation is seen.   Electronically Signed   By: Julian Hy M.D.   On: 11/27/2013 20:24   Dg Chest Port 1 View  11/27/2013   CLINICAL DATA:  [place] out of a tree stand landing feet first, low back and RIGHT ankle pain  EXAM: PORTABLE CHEST - 1 VIEW  COMPARISON:  Portable exam 2000 hr without priors for comparison.  FINDINGS: Low lung volumes.  Enlargement of cardiac silhouette and prominent mediastinum potentially accentuated by AP portable supine technique.  Pulmonary vascular congestion.  RIGHT basilar atelectasis and accentuation of perihilar markings.  No gross infiltrate, pleural effusion, pneumothorax, or fracture.  IMPRESSION: Low lung volumes with RIGHT basilar atelectasis and accentuated perihilar markings.  Prominent heart and mediastinum may be related to technique ; consider followup upright PA and lateral chest radiographs to better assess mediastinum.   Electronically Signed   By: Lavonia Dana M.D.   On: 11/27/2013 20:25   Dg Foot  Complete Right  11/27/2013   CLINICAL DATA:  [place] 16 feet out of a tree, RIGHT leg swelling at ankle  EXAM: RIGHT FOOT COMPLETE - 3+ VIEW  COMPARISON:  Portable exam 2118 hr without priors for comparison  FINDINGS: Exam slightly limited by positioning and portable technique.  Osseous mineralization grossly normal.  Joint spaces preserved.  Plantar calcaneal spur.  Soft tissue swelling lower leg.  No definite acute fracture, dislocation or bone destruction identified.  IMPRESSION: No definite acute osseous abnormalities within limitations as above.   Electronically Signed   By: Lavonia Dana M.D.   On: 11/27/2013 21:40    ROS as above Blood pressure 154/94, pulse 93, temperature 99.1 F (37.3 C), temperature source Oral, resp. rate 27, SpO2 96.00%. Physical  Exam  General: An alert and pleasant 53 year old white male on a backboard in a cervical collar  HEENT: Normocephalic, atraumatic, pupils equal round reactive light, extraocular muscles intact, there is no evidence of CSF rhinorrhea or otorrhea  Neck: Supple without masses, deformities, tracheal deviation. He has a normal cervical range of motion. Spurling's testing is negative, Lhermitt sign was not present  Thorax: Symmetric  Abdomen: Soft  Heart: Regular rhythm  Extremities: The patient has some swelling of his right ankle  Back exam: The patient is tender in the lumbar spine  Neurologic exam: The patient is alert and oriented x3. Glasgow Coma Scale 15. Cranial nerves II through XII were examined bilaterally and grossly normal. Vision and hearing are grossly normal bilaterally. Motor strength is 5 over 5 his bile biceps, triceps, hand grip, psoas, quadriceps, gastrocnemius, extensor hallucis longus/dorsiflexors. Cerebellar function is intact to rapid alternating movements of the upper extremities bilaterally. Sensory function is intact to light touch sensation in the lower extremities. He does have some perineal numbness. By the ER doctors report he has normal rectal tone.  I have reviewed the patient's CT of the chest, abdomen, and pelvis only as it pertains to his spine. The patient has some lumbar degenerative changes. He has a severe L3 burst fracture, a 3 column injury, with severe spinal stenosis. He has a mild L1 fracture.  Assessment/Plan: L1 and L3 fracture: I have discussed situation with the patient. I have told him that his L1 fracture is mild and will heal in an orthosis. His L3 fracture is severe and unstable with significant spinal stenosis. We have discussed the various treatment options which consist of prolonged bed rest versus surgery. I have recommended the latter. I described the procedure of an L3 decompression, laminectomy and posterior instrumentation and fusion with  pedicle screws and rods from approximately L1-L5. I have described the surgery to them. We have discussed the risks of surgery including the risks of anesthesia, hemorrhage, infection, spinal fluid leak, nerve injury, instrumentation malplacement or malfunction, fusion failure, failure to relieve the pain, medical risk, etc. I have answered all the patient's questions. He was to proceed with surgery. We'll plan is for the a.m. of 11/29/2013. In the meantime we will keep the patient at bed rest.  Aarica Wax D 11/27/2013, 10:30 PM

## 2013-11-27 NOTE — ED Provider Notes (Signed)
D/w dr Lovell Sheehan, nsgy Will see patient Pt informed of CT results Pt is neuro intact  Joya Gaskins, MD 11/27/13 2134

## 2013-11-27 NOTE — ED Notes (Signed)
Patient transported to CT 

## 2013-11-28 ENCOUNTER — Encounter (HOSPITAL_COMMUNITY): Payer: Self-pay | Admitting: Emergency Medicine

## 2013-11-28 LAB — URINALYSIS, ROUTINE W REFLEX MICROSCOPIC
BILIRUBIN URINE: NEGATIVE
GLUCOSE, UA: NEGATIVE mg/dL
KETONES UR: 15 mg/dL — AB
Leukocytes, UA: NEGATIVE
Nitrite: NEGATIVE
PROTEIN: NEGATIVE mg/dL
Specific Gravity, Urine: 1.025 (ref 1.005–1.030)
Urobilinogen, UA: 0.2 mg/dL (ref 0.0–1.0)
pH: 5 (ref 5.0–8.0)

## 2013-11-28 LAB — URINE MICROSCOPIC-ADD ON

## 2013-11-28 LAB — TYPE AND SCREEN
ABO/RH(D): O POS
ANTIBODY SCREEN: NEGATIVE

## 2013-11-28 LAB — SURGICAL PCR SCREEN
MRSA, PCR: NEGATIVE
Staphylococcus aureus: NEGATIVE

## 2013-11-28 LAB — ABO/RH: ABO/RH(D): O POS

## 2013-11-28 MED ORDER — SODIUM CHLORIDE 0.9 % IV SOLN: 250.0000 mL | INTRAVENOUS | Status: DC | PRN

## 2013-11-28 MED ORDER — PANTOPRAZOLE SODIUM 40 MG IV SOLR
40.0000 mg | Freq: Every day | INTRAVENOUS | Status: DC
  Filled 2013-11-28: qty 40

## 2013-11-28 MED ORDER — SODIUM CHLORIDE 0.9 % IV SOLN: Freq: Once | INTRAVENOUS | Status: DC

## 2013-11-28 MED ORDER — LACTATED RINGERS IV SOLN
INTRAVENOUS | Status: DC
  Administered 2013-11-28: 03:00:00 via INTRAVENOUS
  Filled 2013-11-28 (×3): qty 1000

## 2013-11-28 NOTE — ED Notes (Signed)
Dr. Tressie Stalker, neurosurgeon, at bedside.

## 2013-11-28 NOTE — Progress Notes (Signed)
UR complete.  Bynum Mccullars RN, MSN 

## 2013-11-28 NOTE — Progress Notes (Signed)
Patient ID: Billy Collins, male   DOB: 1960-04-05, 53 y.o.   MRN: [id number] Subjective:  The patient is alert and pleasant. He says he feels "better". He denies leg weakness.  Objective: Vital signs in last 24 hours: Temp:  [98.4 F (36.9 C)-99.5 F (37.5 C)] 98.4 F (36.9 C) (09/11 0601) Pulse Rate:  [59-99] 84 (09/11 0601) Resp:  [15-29] 19 (09/11 0601) BP: (129-167)/(80-127) 129/80 mmHg (09/11 0601) SpO2:  [9 %-100 %] 93 % (09/11 0601) Weight:  [81.647 kg (180 lb)] 81.647 kg (180 lb) (09/11 0233)  Intake/Output from previous day: 09/10 0701 - 09/11 0700 In: 130 [P.O.:30; I.V.:100] Out: 380 [Urine:380] Intake/Output this shift:    Physical exam the patient is alert and oriented x3. His strength is normal his bilateral quadriceps, gastrocnemius, dorsiflexors  Lab Results:  Recent Labs  11/27/13 2000 11/27/13 2016  WBC 14.8*  --   HGB 14.8 15.6  HCT 41.7 46.0  PLT 181  --    BMET  Recent Labs  11/27/13 2000 11/27/13 2016  NA 142 141  K 3.8 3.6*  CL 107 109  CO2 20  --   GLUCOSE 110* 110*  BUN 23 24*  CREATININE 0.97 1.00  CALCIUM 9.4  --     Studies/Results: Dg Tibia/fibula Right  11/27/2013   CLINICAL DATA:  Fall 16 feet out of a tree, right leg swelling  EXAM: RIGHT TIBIA AND FIBULA - 2 VIEW  COMPARISON:  None.  FINDINGS: Distal tibia and fibula are excluded.  No fracture or dislocation is seen.  The visualized soft tissues are unremarkable.  IMPRESSION: No fracture or dislocation is seen.   Electronically Signed   By: Charline Bills M.D.   On: 11/27/2013 21:37   Dg Ankle Complete Right  11/27/2013   CLINICAL DATA:  Patient fell 16 feet out of a tree. Right ankle pain and swelling.  EXAM: RIGHT ANKLE - COMPLETE 3+ VIEW  COMPARISON:  None.  FINDINGS: Prominent lateral soft tissue swelling over the right ankle. No evidence of acute fracture or dislocation. No focal bone lesion or bone destruction. Bone cortex and trabecular architecture appear intact.  Plantar calcaneal spur is incidentally noted.  IMPRESSION: Lateral soft tissue swelling. No acute bony abnormalities suggested appear   Electronically Signed   By: Burman Nieves M.D.   On: 11/27/2013 21:37   Ct Head Wo Contrast  11/27/2013   CLINICAL DATA:  Fall from tree stand  EXAM: CT HEAD WITHOUT CONTRAST  CT CERVICAL SPINE WITHOUT CONTRAST  TECHNIQUE: Multidetector CT imaging of the head and cervical spine was performed following the standard protocol without intravenous contrast. Multiplanar CT image reconstructions of the cervical spine were also generated.  COMPARISON:  None.  FINDINGS: CT HEAD FINDINGS  No evidence of parenchymal hemorrhage or extra-axial fluid collection. No mass lesion, mass effect, or midline shift.  No CT evidence of acute infarction.  Cerebral volume is within normal limits.  No ventriculomegaly.  Mucosal thickening in the bilateral ethmoid and maxillary sinuses. Partial opacification of the left sphenoid sinus. The mastoid air cells are clear.  No evidence of calvarial fracture.  CT CERVICAL SPINE FINDINGS  Reversal the normal cervical lordosis.  No evidence of fracture or dislocation. Vertebral body heights are maintained. Dens appears intact.  No prevertebral soft tissue swelling.  Moderate multilevel degenerative changes.  Visualized thyroid is unremarkable.  IMPRESSION: No evidence of acute intracranial abnormality.  No evidence of traumatic injury to the cervical spine. Moderate multilevel degenerative changes.  Electronically Signed   By: Charline Bills M.D.   On: 11/27/2013 20:47   Ct Chest W Contrast  11/27/2013   CLINICAL DATA:  Trauma  EXAM: CT CHEST, ABDOMEN, AND PELVIS WITH CONTRAST  TECHNIQUE: Multidetector CT imaging of the chest, abdomen and pelvis was performed following the standard protocol during bolus administration of intravenous contrast.  CONTRAST:  OMNIPAQUE IOHEXOL 300 MG/ML  SOLN  COMPARISON:  Prior radiographs from earlier the same day   FINDINGS: CT CHEST FINDINGS  Thyroid gland within normal limits.  No mediastinal, hilar, or axillary adenopathy.  Intrathoracic aorta is of normal caliber without evidence of acute traumatic injury. Great vessels are intact. No mediastinal hematoma.  Heart size is normal.  Pulmonary arteries within normal limits.  No pneumothorax or pulmonary contusion. Subsegmental atelectasis seen dependently within the bilateral lower lobes. No focal infiltrates. No worrisome pulmonary nodule or mass.  No acute fracture within the thorax.  CT ABDOMEN AND PELVIS FINDINGS  The liver is intact. Gallbladder is normal. Spleen is intact. Adrenal glands within normal limits. Pancreas is unremarkable. Kidneys demonstrate a normal contrast enhanced appearance without evidence of acute abnormality. Single 3 mm nonobstructive left renal calculus noted.  Stomach is normal. No evidence of bowel obstruction or acute bowel injury. No acute inflammatory changes seen about the bowels. Few scattered colonic diverticula noted. Fat containing paraumbilical hernia noted.  Bladder prostate within normal limits.  No free intraperitoneal air.  The intra abdominal aorta is and its branch vessels are intact. Soft tissue stranding tracking adjacent to the left aspect of the infrarenal aorta is thought to be secondary to adjacent psoas hematoma.  No acute pelvic fracture.  There is an acute comminuted fracture through the anterior superior endplate of L1 with associated 25-30% of height loss. No significant retropulsion. Faint linear lucencies suggestive of nondisplaced fractures extending through the bilateral pedicles are suspected. No significant displacement.  There is a severe burst type fracture of the L3 vertebral body with approximately 60-70% of height loss. There is approximately 1.2 cm of retropulsion with resultant severe canal stenosis. The thecal sac measures approximately 3 mm in AP diameter. There is disruption of both the anterior and  posterior columns. There is disruption of the posterior column as well as there is an acute nondisplaced fracture of the right lamina of L3. There are is an associated fracture of the right transverse process of L3.  There is an associated hematoma within the left psoas musculature.  Curvilinear osseous density along the left lateral aspect of the L4 superior endplate is suspicious for a fractured osteophyte (series 3, image 81). The L4 superior endplate itself appears intact. There is question of additional small fractures through endplate osteophytes at the inferior endplate of L2 bilaterally.  IMPRESSION: 1. Acute burst type fracture involving the L3 vertebral body with associated 1.2 cm of retropulsion and resultant severe central canal stenosis. There is disruption of all 3 columns, consistent with an unstable fracture. 2. Acute compression fracture of L1 with associated 25-30% of height loss without retropulsion. 3. Left psoas hematoma. 4. Acute fracture through the right transverse process of L3. 5. Question fractured osteophyte adjacent to the left lateral aspect of the L4 superior endplate. 6. No other acute traumatic injury within the chest, abdomen, and pelvis. 7. 3 mm nonobstructive left renal calculus.  Critical Value/emergent results were called by telephone at the time of interpretation on 11/27/2013 at 8:49 pm to Dr. Zadie Rhine , who verbally acknowledged these results.  Electronically Signed   By: Rise Mu M.D.   On: 11/27/2013 21:17   Ct Cervical Spine Wo Contrast  11/27/2013   CLINICAL DATA:  Fall from tree stand  EXAM: CT HEAD WITHOUT CONTRAST  CT CERVICAL SPINE WITHOUT CONTRAST  TECHNIQUE: Multidetector CT imaging of the head and cervical spine was performed following the standard protocol without intravenous contrast. Multiplanar CT image reconstructions of the cervical spine were also generated.  COMPARISON:  None.  FINDINGS: CT HEAD FINDINGS  No evidence of parenchymal  hemorrhage or extra-axial fluid collection. No mass lesion, mass effect, or midline shift.  No CT evidence of acute infarction.  Cerebral volume is within normal limits.  No ventriculomegaly.  Mucosal thickening in the bilateral ethmoid and maxillary sinuses. Partial opacification of the left sphenoid sinus. The mastoid air cells are clear.  No evidence of calvarial fracture.  CT CERVICAL SPINE FINDINGS  Reversal the normal cervical lordosis.  No evidence of fracture or dislocation. Vertebral body heights are maintained. Dens appears intact.  No prevertebral soft tissue swelling.  Moderate multilevel degenerative changes.  Visualized thyroid is unremarkable.  IMPRESSION: No evidence of acute intracranial abnormality.  No evidence of traumatic injury to the cervical spine. Moderate multilevel degenerative changes.   Electronically Signed   By: Charline Bills M.D.   On: 11/27/2013 20:47   Ct Abdomen Pelvis W Contrast  11/27/2013   CLINICAL DATA:  Trauma  EXAM: CT CHEST, ABDOMEN, AND PELVIS WITH CONTRAST  TECHNIQUE: Multidetector CT imaging of the chest, abdomen and pelvis was performed following the standard protocol during bolus administration of intravenous contrast.  CONTRAST:  OMNIPAQUE IOHEXOL 300 MG/ML  SOLN  COMPARISON:  Prior radiographs from earlier the same day  FINDINGS: CT CHEST FINDINGS  Thyroid gland within normal limits.  No mediastinal, hilar, or axillary adenopathy.  Intrathoracic aorta is of normal caliber without evidence of acute traumatic injury. Great vessels are intact. No mediastinal hematoma.  Heart size is normal.  Pulmonary arteries within normal limits.  No pneumothorax or pulmonary contusion. Subsegmental atelectasis seen dependently within the bilateral lower lobes. No focal infiltrates. No worrisome pulmonary nodule or mass.  No acute fracture within the thorax.  CT ABDOMEN AND PELVIS FINDINGS  The liver is intact. Gallbladder is normal. Spleen is intact. Adrenal glands  within normal limits. Pancreas is unremarkable. Kidneys demonstrate a normal contrast enhanced appearance without evidence of acute abnormality. Single 3 mm nonobstructive left renal calculus noted.  Stomach is normal. No evidence of bowel obstruction or acute bowel injury. No acute inflammatory changes seen about the bowels. Few scattered colonic diverticula noted. Fat containing paraumbilical hernia noted.  Bladder prostate within normal limits.  No free intraperitoneal air.  The intra abdominal aorta is and its branch vessels are intact. Soft tissue stranding tracking adjacent to the left aspect of the infrarenal aorta is thought to be secondary to adjacent psoas hematoma.  No acute pelvic fracture.  There is an acute comminuted fracture through the anterior superior endplate of L1 with associated 25-30% of height loss. No significant retropulsion. Faint linear lucencies suggestive of nondisplaced fractures extending through the bilateral pedicles are suspected. No significant displacement.  There is a severe burst type fracture of the L3 vertebral body with approximately 60-70% of height loss. There is approximately 1.2 cm of retropulsion with resultant severe canal stenosis. The thecal sac measures approximately 3 mm in AP diameter. There is disruption of both the anterior and posterior columns. There is disruption of the posterior  column as well as there is an acute nondisplaced fracture of the right lamina of L3. There are is an associated fracture of the right transverse process of L3.  There is an associated hematoma within the left psoas musculature.  Curvilinear osseous density along the left lateral aspect of the L4 superior endplate is suspicious for a fractured osteophyte (series 3, image 81). The L4 superior endplate itself appears intact. There is question of additional small fractures through endplate osteophytes at the inferior endplate of L2 bilaterally.  IMPRESSION: 1. Acute burst type fracture  involving the L3 vertebral body with associated 1.2 cm of retropulsion and resultant severe central canal stenosis. There is disruption of all 3 columns, consistent with an unstable fracture. 2. Acute compression fracture of L1 with associated 25-30% of height loss without retropulsion. 3. Left psoas hematoma. 4. Acute fracture through the right transverse process of L3. 5. Question fractured osteophyte adjacent to the left lateral aspect of the L4 superior endplate. 6. No other acute traumatic injury within the chest, abdomen, and pelvis. 7. 3 mm nonobstructive left renal calculus.  Critical Value/emergent results were called by telephone at the time of interpretation on 11/27/2013 at 8:49 pm to Dr. Zadie Rhine , who verbally acknowledged these results.   Electronically Signed   By: Rise Mu M.D.   On: 11/27/2013 21:17   Dg Pelvis Portable  11/27/2013   CLINICAL DATA:  Fall  EXAM: PORTABLE PELVIS 1-2 VIEWS  COMPARISON:  None.  FINDINGS: No fracture or dislocation is seen.  Bilateral hip joints are preserved.  Visualized bony pelvis appears intact.  IMPRESSION: No fracture or dislocation is seen.   Electronically Signed   By: Charline Bills M.D.   On: 11/27/2013 20:24   Dg Chest Port 1 View  11/27/2013   CLINICAL DATA:  Larey Seat out of a tree stand landing feet first, low back and RIGHT ankle pain  EXAM: PORTABLE CHEST - 1 VIEW  COMPARISON:  Portable exam 2000 hr without priors for comparison.  FINDINGS: Low lung volumes.  Enlargement of cardiac silhouette and prominent mediastinum potentially accentuated by AP portable supine technique.  Pulmonary vascular congestion.  RIGHT basilar atelectasis and accentuation of perihilar markings.  No gross infiltrate, pleural effusion, pneumothorax, or fracture.  IMPRESSION: Low lung volumes with RIGHT basilar atelectasis and accentuated perihilar markings.  Prominent heart and mediastinum may be related to technique ; consider followup upright PA and  lateral chest radiographs to better assess mediastinum.   Electronically Signed   By: Ulyses Southward M.D.   On: 11/27/2013 20:25   Dg Foot Complete Right  11/27/2013   CLINICAL DATA:  Larey Seat 16 feet out of a tree, RIGHT leg swelling at ankle  EXAM: RIGHT FOOT COMPLETE - 3+ VIEW  COMPARISON:  Portable exam 2118 hr without priors for comparison  FINDINGS: Exam slightly limited by positioning and portable technique.  Osseous mineralization grossly normal.  Joint spaces preserved.  Plantar calcaneal spur.  Soft tissue swelling lower leg.  No definite acute fracture, dislocation or bone destruction identified.  IMPRESSION: No definite acute osseous abnormalities within limitations as above.   Electronically Signed   By: Ulyses Southward M.D.   On: 11/27/2013 21:40    Assessment/Plan: L1, L3 fracture, lumbar stenosis, lumbago: I have again discussed situation with the patient. We have discussed the various treatment options. I have answered all his questions regarding surgery. We will likely proceed with surgery. We'll plan surgery for tomorrow morning.  LOS: 1 day  Natalie Leclaire D 11/28/2013, 7:34 AM

## 2013-11-28 NOTE — Progress Notes (Signed)
Received pt from the ED, a&o x 4. Pain level at 3/10. Neck collar in place. Pt oriented to room and unit. Tele in place with initial reading of NSR. Call bell in place. Will cont to monitor.

## 2013-11-28 NOTE — ED Provider Notes (Signed)
I have personally seen and examined the patient.  I have discussed the plan of care with the resident.  I have reviewed the documentation on PMH/FH/Soc. History.  I have reviewed the documentation of the resident and agree.  I spoke to neurosurgery and arranged admission Pt stablized in the ED He had no neuro deficits in his lower extremities in the ED  Joya Gaskins, MD 11/28/13 2315

## 2013-11-28 NOTE — ED Notes (Signed)
Mother, brother, and son at bedside.

## 2013-11-28 NOTE — ED Notes (Signed)
Pt requested that staff throw away his tshirt and underwear because they were dirty.

## 2013-11-29 ENCOUNTER — Inpatient Hospital Stay (HOSPITAL_COMMUNITY): Payer: BC Managed Care – PPO | Admitting: Certified Registered Nurse Anesthetist

## 2013-11-29 ENCOUNTER — Inpatient Hospital Stay (HOSPITAL_COMMUNITY): Payer: BC Managed Care – PPO

## 2013-11-29 ENCOUNTER — Encounter (HOSPITAL_COMMUNITY)
Admission: EM | Disposition: A | Discharge status: Home / Self Care | Payer: BC Managed Care – PPO | Source: Home / Self Care | Attending: Neurosurgery

## 2013-11-29 ENCOUNTER — Encounter (HOSPITAL_COMMUNITY): Payer: BC Managed Care – PPO | Admitting: Certified Registered Nurse Anesthetist

## 2013-11-29 HISTORY — PX: POSTERIOR LUMBAR FUSION 4 LEVEL: SHX6037

## 2013-11-29 SURGERY — POSTERIOR LUMBAR FUSION 4 LEVEL
Anesthesia: General | Site: Back

## 2013-11-29 MED ORDER — FENTANYL CITRATE 0.05 MG/ML IJ SOLN
INTRAMUSCULAR | Status: DC | PRN
  Administered 2013-11-29: 100 ug via INTRAVENOUS
  Administered 2013-11-29: 50 ug via INTRAVENOUS
  Administered 2013-11-29: 250 ug via INTRAVENOUS
  Administered 2013-11-29: 100 ug via INTRAVENOUS
  Administered 2013-11-29: 50 ug via INTRAVENOUS
  Administered 2013-11-29 (×3): 100 ug via INTRAVENOUS
  Administered 2013-11-29: 50 ug via INTRAVENOUS
  Administered 2013-11-29: 100 ug via INTRAVENOUS
  Administered 2013-11-29 (×2): 50 ug via INTRAVENOUS

## 2013-11-29 MED ORDER — CEFAZOLIN SODIUM-DEXTROSE 2-3 GM-% IV SOLR
INTRAVENOUS | Status: DC | PRN
  Administered 2013-11-29 (×2): 2 g via INTRAVENOUS

## 2013-11-29 MED ORDER — CEFAZOLIN SODIUM-DEXTROSE 2-3 GM-% IV SOLR
2.0000 g | Freq: Three times a day (TID) | INTRAVENOUS | Status: AC
  Administered 2013-11-29 – 2013-11-30 (×2): 2 g via INTRAVENOUS
  Filled 2013-11-29 (×2): qty 50

## 2013-11-29 MED ORDER — ONDANSETRON HCL 4 MG/2ML IJ SOLN
INTRAMUSCULAR | Status: DC | PRN
  Administered 2013-11-29: 4 mg via INTRAVENOUS

## 2013-11-29 MED ORDER — BUPIVACAINE-EPINEPHRINE (PF) 0.5% -1:200000 IJ SOLN
INTRAMUSCULAR | Status: DC | PRN
  Administered 2013-11-29: 20 mL

## 2013-11-29 MED ORDER — MIDAZOLAM HCL 2 MG/2ML IJ SOLN: 0.5000 mg | Freq: Once | INTRAMUSCULAR | Status: DC | PRN

## 2013-11-29 MED ORDER — PHENOL 1.4 % MT LIQD: 1.0000 | OROMUCOSAL | Status: DC | PRN

## 2013-11-29 MED ORDER — ZOLPIDEM TARTRATE 5 MG PO TABS
5.0000 mg | ORAL_TABLET | Freq: Every evening | ORAL | Status: DC | PRN
  Administered 2013-11-29: 5 mg via ORAL
  Filled 2013-11-29: qty 1

## 2013-11-29 MED ORDER — FENTANYL CITRATE 0.05 MG/ML IJ SOLN
INTRAMUSCULAR | Status: AC
  Filled 2013-11-29: qty 5

## 2013-11-29 MED ORDER — 0.9 % SODIUM CHLORIDE (POUR BTL) OPTIME
TOPICAL | Status: DC | PRN
  Administered 2013-11-29: 1000 mL

## 2013-11-29 MED ORDER — PROPOFOL 10 MG/ML IV BOLUS
INTRAVENOUS | Status: DC | PRN
  Administered 2013-11-29: 200 mg via INTRAVENOUS

## 2013-11-29 MED ORDER — LIDOCAINE HCL (CARDIAC) 20 MG/ML IV SOLN
INTRAVENOUS | Status: DC | PRN
  Administered 2013-11-29: 20 mg via INTRAVENOUS

## 2013-11-29 MED ORDER — OXYCODONE-ACETAMINOPHEN 5-325 MG PO TABS
1.0000 | ORAL_TABLET | ORAL | Status: DC | PRN
  Administered 2013-11-29 – 2013-12-05 (×22): 2 via ORAL
  Filled 2013-11-29 (×23): qty 2

## 2013-11-29 MED ORDER — MEPERIDINE HCL 25 MG/ML IJ SOLN: 6.2500 mg | INTRAMUSCULAR | Status: DC | PRN

## 2013-11-29 MED ORDER — PROMETHAZINE HCL 25 MG/ML IJ SOLN
INTRAMUSCULAR | Status: AC
  Administered 2013-11-29: 6.25 mg via INTRAVENOUS
  Filled 2013-11-29: qty 1

## 2013-11-29 MED ORDER — STERILE WATER FOR INJECTION IJ SOLN
INTRAMUSCULAR | Status: AC
  Filled 2013-11-29: qty 10

## 2013-11-29 MED ORDER — CEFAZOLIN SODIUM-DEXTROSE 2-3 GM-% IV SOLR
INTRAVENOUS | Status: AC
  Filled 2013-11-29: qty 50

## 2013-11-29 MED ORDER — NEOSTIGMINE METHYLSULFATE 10 MG/10ML IV SOLN
INTRAVENOUS | Status: AC
  Filled 2013-11-29: qty 1

## 2013-11-29 MED ORDER — HYDROCODONE-ACETAMINOPHEN 5-325 MG PO TABS: 1.0000 | ORAL_TABLET | ORAL | Status: DC | PRN

## 2013-11-29 MED ORDER — GLYCOPYRROLATE 0.2 MG/ML IJ SOLN
INTRAMUSCULAR | Status: DC | PRN
  Administered 2013-11-29: 0.6 mg via INTRAVENOUS
  Administered 2013-11-29: 0.4 mg via INTRAVENOUS

## 2013-11-29 MED ORDER — NEOSTIGMINE METHYLSULFATE 10 MG/10ML IV SOLN
INTRAVENOUS | Status: DC | PRN
  Administered 2013-11-29: 2 mg via INTRAVENOUS
  Administered 2013-11-29: 3 mg via INTRAVENOUS

## 2013-11-29 MED ORDER — PROMETHAZINE HCL 25 MG/ML IJ SOLN
6.2500 mg | INTRAMUSCULAR | Status: AC | PRN
  Administered 2013-11-29 (×2): 6.25 mg via INTRAVENOUS

## 2013-11-29 MED ORDER — LACTATED RINGERS IV SOLN
INTRAVENOUS | Status: DC | PRN
  Administered 2013-11-29 (×3): via INTRAVENOUS

## 2013-11-29 MED ORDER — HYDROMORPHONE HCL PF 1 MG/ML IJ SOLN: 0.2500 mg | INTRAMUSCULAR | Status: DC | PRN

## 2013-11-29 MED ORDER — LACTATED RINGERS IV SOLN
INTRAVENOUS | Status: DC
  Administered 2013-11-29: 17:00:00 via INTRAVENOUS

## 2013-11-29 MED ORDER — THROMBIN 20000 UNITS EX SOLR
CUTANEOUS | Status: DC | PRN
  Administered 2013-11-29: 09:00:00 via TOPICAL

## 2013-11-29 MED ORDER — BACITRACIN 50000 UNITS IM SOLR
INTRAMUSCULAR | Status: DC | PRN
  Administered 2013-11-29: 09:00:00

## 2013-11-29 MED ORDER — ACETAMINOPHEN 325 MG PO TABS
650.0000 mg | ORAL_TABLET | ORAL | Status: DC | PRN
  Administered 2013-12-04 – 2013-12-05 (×2): 650 mg via ORAL
  Filled 2013-11-29 (×2): qty 2

## 2013-11-29 MED ORDER — PANTOPRAZOLE SODIUM 40 MG IV SOLR: 40.0000 mg | Freq: Every day | INTRAVENOUS | Status: DC

## 2013-11-29 MED ORDER — DEXAMETHASONE SODIUM PHOSPHATE 10 MG/ML IJ SOLN
INTRAMUSCULAR | Status: AC
Start: 2013-11-29 — End: 2013-11-29
  Filled 2013-11-29: qty 1

## 2013-11-29 MED ORDER — DEXAMETHASONE SODIUM PHOSPHATE 10 MG/ML IJ SOLN
INTRAMUSCULAR | Status: DC | PRN
  Administered 2013-11-29: 10 mg via INTRAVENOUS

## 2013-11-29 MED ORDER — DIAZEPAM 5 MG PO TABS
5.0000 mg | ORAL_TABLET | Freq: Four times a day (QID) | ORAL | Status: DC | PRN
  Administered 2013-12-01 – 2013-12-04 (×5): 5 mg via ORAL
  Filled 2013-11-29 (×5): qty 1

## 2013-11-29 MED ORDER — DIPHENHYDRAMINE HCL 50 MG/ML IJ SOLN
INTRAMUSCULAR | Status: AC
  Filled 2013-11-29: qty 1

## 2013-11-29 MED ORDER — PROPOFOL 10 MG/ML IV BOLUS
INTRAVENOUS | Status: AC
  Filled 2013-11-29: qty 20

## 2013-11-29 MED ORDER — MIDAZOLAM HCL 2 MG/2ML IJ SOLN
INTRAMUSCULAR | Status: AC
  Filled 2013-11-29: qty 2

## 2013-11-29 MED ORDER — MENTHOL 3 MG MT LOZG: 1.0000 | LOZENGE | OROMUCOSAL | Status: DC | PRN

## 2013-11-29 MED ORDER — EPHEDRINE SULFATE 50 MG/ML IJ SOLN
INTRAMUSCULAR | Status: AC
  Filled 2013-11-29: qty 1

## 2013-11-29 MED ORDER — PHENYLEPHRINE HCL 10 MG/ML IJ SOLN
INTRAMUSCULAR | Status: DC | PRN
  Administered 2013-11-29 (×4): 80 ug via INTRAVENOUS

## 2013-11-29 MED ORDER — OXYCODONE HCL 5 MG/5ML PO SOLN: 5.0000 mg | Freq: Once | ORAL | Status: DC | PRN

## 2013-11-29 MED ORDER — DIPHENHYDRAMINE HCL 50 MG/ML IJ SOLN
INTRAMUSCULAR | Status: DC | PRN
  Administered 2013-11-29: 10 mg via INTRAVENOUS

## 2013-11-29 MED ORDER — ROCURONIUM BROMIDE 100 MG/10ML IV SOLN
INTRAVENOUS | Status: DC | PRN
  Administered 2013-11-29 (×2): 50 mg via INTRAVENOUS

## 2013-11-29 MED ORDER — MORPHINE SULFATE 2 MG/ML IJ SOLN
1.0000 mg | INTRAMUSCULAR | Status: DC | PRN
  Administered 2013-12-01 – 2013-12-02 (×4): 2 mg via INTRAVENOUS
  Filled 2013-11-29 (×4): qty 1

## 2013-11-29 MED ORDER — ONDANSETRON HCL 4 MG/2ML IJ SOLN
4.0000 mg | INTRAMUSCULAR | Status: DC | PRN
  Administered 2013-12-04: 4 mg via INTRAVENOUS
  Filled 2013-11-29: qty 2

## 2013-11-29 MED ORDER — DOCUSATE SODIUM 100 MG PO CAPS
100.0000 mg | ORAL_CAPSULE | Freq: Two times a day (BID) | ORAL | Status: DC
  Administered 2013-11-29 – 2013-12-05 (×12): 100 mg via ORAL
  Filled 2013-11-29 (×12): qty 1

## 2013-11-29 MED ORDER — ONDANSETRON HCL 4 MG/2ML IJ SOLN
INTRAMUSCULAR | Status: AC
  Filled 2013-11-29: qty 2

## 2013-11-29 MED ORDER — BACITRACIN ZINC 500 UNIT/GM EX OINT
TOPICAL_OINTMENT | CUTANEOUS | Status: DC | PRN
  Administered 2013-11-29: 1 via TOPICAL

## 2013-11-29 MED ORDER — OXYCODONE HCL 5 MG PO TABS: 5.0000 mg | ORAL_TABLET | Freq: Once | ORAL | Status: DC | PRN

## 2013-11-29 MED ORDER — EPHEDRINE SULFATE 50 MG/ML IJ SOLN
INTRAMUSCULAR | Status: DC | PRN
  Administered 2013-11-29 (×2): 10 mg via INTRAVENOUS

## 2013-11-29 MED ORDER — LIDOCAINE HCL (CARDIAC) 20 MG/ML IV SOLN
INTRAVENOUS | Status: AC
  Filled 2013-11-29: qty 5

## 2013-11-29 MED ORDER — ALUM & MAG HYDROXIDE-SIMETH 200-200-20 MG/5ML PO SUSP: 30.0000 mL | Freq: Four times a day (QID) | ORAL | Status: DC | PRN

## 2013-11-29 MED ORDER — ROCURONIUM BROMIDE 50 MG/5ML IV SOLN
INTRAVENOUS | Status: AC
  Filled 2013-11-29: qty 1

## 2013-11-29 MED ORDER — FENTANYL CITRATE 0.05 MG/ML IJ SOLN
INTRAMUSCULAR | Status: AC
Start: 2013-11-29 — End: 2013-11-29
  Filled 2013-11-29: qty 5

## 2013-11-29 MED ORDER — DEXMEDETOMIDINE HCL 200 MCG/2ML IV SOLN
INTRAVENOUS | Status: DC | PRN
  Administered 2013-11-29 (×2): 8 ug via INTRAVENOUS
  Administered 2013-11-29: 4 ug via INTRAVENOUS
  Administered 2013-11-29: 8 ug via INTRAVENOUS
  Administered 2013-11-29: 4 ug via INTRAVENOUS

## 2013-11-29 MED ORDER — GLYCOPYRROLATE 0.2 MG/ML IJ SOLN
INTRAMUSCULAR | Status: AC
  Filled 2013-11-29: qty 4

## 2013-11-29 MED ORDER — ACETAMINOPHEN 650 MG RE SUPP: 650.0000 mg | RECTAL | Status: DC | PRN

## 2013-11-29 MED ORDER — ARTIFICIAL TEARS OP OINT
TOPICAL_OINTMENT | OPHTHALMIC | Status: DC | PRN
  Administered 2013-11-29: 1 via OPHTHALMIC

## 2013-11-29 SURGICAL SUPPLY — 76 items
APL SKNCLS STERI-STRIP NONHPOA (GAUZE/BANDAGES/DRESSINGS) ×1
BAG DECANTER FOR FLEXI CONT (MISCELLANEOUS) ×2 IMPLANT
BENZOIN TINCTURE PRP APPL 2/3 (GAUZE/BANDAGES/DRESSINGS) ×2 IMPLANT
BLADE SURG ROTATE 9660 (MISCELLANEOUS) IMPLANT
BRUSH SCRUB EZ PLAIN DRY (MISCELLANEOUS) ×2 IMPLANT
BUR ACORN 6.0 (BURR) ×2 IMPLANT
BUR ACORN 6.0 PRECISION (BURR) ×2 IMPLANT
BUR MATCHSTICK NEURO 3.0 LAGG (BURR) ×2 IMPLANT
CANISTER SUCT 3000ML (MISCELLANEOUS) ×2 IMPLANT
CAP REVERE LOCKING (Cap) ×8 IMPLANT
CONN CROSSLINK REV 6.35 48-60 (Connector) ×2 IMPLANT
CONNECTOR CRSLNK REV6.35 48-60 (Connector) IMPLANT
CONT SPEC 4OZ CLIKSEAL STRL BL (MISCELLANEOUS) ×2 IMPLANT
COVER BACK TABLE 24X17X13 BIG (DRAPES) IMPLANT
DRAPE C-ARM 42X72 X-RAY (DRAPES) ×6 IMPLANT
DRAPE LAPAROTOMY 100X72X124 (DRAPES) ×2 IMPLANT
DRAPE POUCH INSTRU U-SHP 10X18 (DRAPES) ×2 IMPLANT
DRAPE PROXIMA HALF (DRAPES) ×2 IMPLANT
DRAPE SURG 17X23 STRL (DRAPES) ×8 IMPLANT
DURASEAL SPINE SEALANT 3ML (MISCELLANEOUS) ×1 IMPLANT
ELECT BLADE 4.0 EZ CLEAN MEGAD (MISCELLANEOUS) ×6
ELECT REM PT RETURN 9FT ADLT (ELECTROSURGICAL) ×2
ELECTRODE BLDE 4.0 EZ CLN MEGD (MISCELLANEOUS) ×3 IMPLANT
ELECTRODE REM PT RTRN 9FT ADLT (ELECTROSURGICAL) ×1 IMPLANT
GAUZE SPONGE 4X4 12PLY STRL (GAUZE/BANDAGES/DRESSINGS) ×2 IMPLANT
GAUZE SPONGE 4X4 16PLY XRAY LF (GAUZE/BANDAGES/DRESSINGS) ×2 IMPLANT
GLOVE BIO SURGEON STRL SZ 6.5 (GLOVE) ×3 IMPLANT
GLOVE BIO SURGEON STRL SZ7 (GLOVE) ×8 IMPLANT
GLOVE BIO SURGEON STRL SZ8 (GLOVE) ×1 IMPLANT
GLOVE BIO SURGEON STRL SZ8.5 (GLOVE) ×4 IMPLANT
GLOVE BIOGEL PI IND STRL 7.5 (GLOVE) IMPLANT
GLOVE BIOGEL PI INDICATOR 7.5 (GLOVE) ×1
GLOVE EXAM NITRILE LRG STRL (GLOVE) IMPLANT
GLOVE EXAM NITRILE MD LF STRL (GLOVE) IMPLANT
GLOVE EXAM NITRILE XL STR (GLOVE) IMPLANT
GLOVE EXAM NITRILE XS STR PU (GLOVE) IMPLANT
GLOVE SS BIOGEL STRL SZ 8 (GLOVE) ×2 IMPLANT
GLOVE SUPERSENSE BIOGEL SZ 8 (GLOVE) ×2
GOWN STRL REUS W/ TWL LRG LVL3 (GOWN DISPOSABLE) IMPLANT
GOWN STRL REUS W/ TWL XL LVL3 (GOWN DISPOSABLE) ×2 IMPLANT
GOWN STRL REUS W/TWL 2XL LVL3 (GOWN DISPOSABLE) IMPLANT
GOWN STRL REUS W/TWL LRG LVL3 (GOWN DISPOSABLE) ×8
GOWN STRL REUS W/TWL XL LVL3 (GOWN DISPOSABLE) ×4
KIT BASIN OR (CUSTOM PROCEDURE TRAY) ×2 IMPLANT
KIT INFUSE LRG II (Orthopedic Implant) ×1 IMPLANT
KIT ROOM TURNOVER OR (KITS) ×2 IMPLANT
NDL HYPO 21X1.5 SAFETY (NEEDLE) IMPLANT
NEEDLE HYPO 21X1.5 SAFETY (NEEDLE) IMPLANT
NEEDLE HYPO 22GX1.5 SAFETY (NEEDLE) ×2 IMPLANT
NS IRRIG 1000ML POUR BTL (IV SOLUTION) ×2 IMPLANT
PACK LAMINECTOMY NEURO (CUSTOM PROCEDURE TRAY) ×2 IMPLANT
PAD ARMBOARD 7.5X6 YLW CONV (MISCELLANEOUS) ×6 IMPLANT
PATTIES SURGICAL .5 X1 (DISPOSABLE) IMPLANT
PENCIL BUTTON HOLSTER BLD 10FT (ELECTRODE) ×1 IMPLANT
ROD REVERE 6.35 CURVED 125MM (Rod) ×2 IMPLANT
SCREW 7.5X50MM (Screw) ×6 IMPLANT
SCREW REVERE 6.35 75X55MM (Screw) ×2 IMPLANT
SPONGE LAP 4X18 X RAY DECT (DISPOSABLE) IMPLANT
SPONGE NEURO XRAY DETECT 1X3 (DISPOSABLE) IMPLANT
SPONGE SURGIFOAM ABS GEL 100 (HEMOSTASIS) ×2 IMPLANT
STRIP BIOACTIVE 20CC 25X100X8 (Miscellaneous) ×1 IMPLANT
STRIP CLOSURE SKIN 1/2X4 (GAUZE/BANDAGES/DRESSINGS) ×2 IMPLANT
SUT ETHILON 2 0 PSLX (SUTURE) ×1 IMPLANT
SUT ETHILON 3 0 FSL (SUTURE) ×1 IMPLANT
SUT PROLENE 5 0 C 1 36 (SUTURE) ×3 IMPLANT
SUT PROLENE 6 0 BV (SUTURE) ×3 IMPLANT
SUT VIC AB 1 CT1 18XBRD ANBCTR (SUTURE) ×2 IMPLANT
SUT VIC AB 1 CT1 8-18 (SUTURE) ×4
SUT VIC AB 2-0 CP2 18 (SUTURE) ×4 IMPLANT
SYR 20CC LL (SYRINGE) IMPLANT
SYR 20ML ECCENTRIC (SYRINGE) ×2 IMPLANT
TAPE CLOTH SURG 6X10 WHT LF (GAUZE/BANDAGES/DRESSINGS) ×1 IMPLANT
TOWEL OR 17X24 6PK STRL BLUE (TOWEL DISPOSABLE) ×2 IMPLANT
TOWEL OR 17X26 10 PK STRL BLUE (TOWEL DISPOSABLE) ×2 IMPLANT
TRAY FOLEY CATH 14FRSI W/METER (CATHETERS) ×2 IMPLANT
WATER STERILE IRR 1000ML POUR (IV SOLUTION) ×2 IMPLANT

## 2013-11-29 NOTE — Op Note (Signed)
Brief history: The patient is a 53 year old white male who fell out of a deer hunting stand on 11/27/2013 suffering an L1 and L3 fracture. I discussed the various treatment options with the patient including surgery. The patient has weighed the risks, benefits, and alternatives surgery and decided proceed with at L1-L5 decompression, instrumentation, and fusion.  Preoperative diagnosis: L1 compression fracture, L3 burst fracture, lumbar spinal stenosis  Postoperative diagnosis: The same  Procedure: L3 laminectomy /foraminotomies to decompress the bilateral L3 and L4 nerve roots; open reduction of L3 burst fracture; posterior lateral arthrodesis at L1-2, L2-3, L3-4 and L4-5 with local morselized autograft bone and morphogenic protein, Bone graft extender; posterior segmental instrumentation with globus titanium pedicle screws and rods from L1-L5.  Surgeon: Dr. Delma Officer  Asst.: Dr. Marikay Alar  Anesthesia: Gen. endotracheal  Estimated blood loss: 350 cc  Drains: One medium Hemovac  Complications: None  Description of procedure: The patient was brought to the operating room by the anesthesia team. General endotracheal anesthesia was induced. The patient was turned to the prone position on the Wilson frame. The patient's lumbosacral region was then prepared with Betadine scrub and Betadine solution. Sterile drapes were applied.  I then injected the area to be incised with Marcaine with epinephrine solution. I then used the scalpel to make a linear midline incision over the L1-L5 spinous processes. I then used electrocautery to perform a bilateral subperiosteal dissection exposing the spinous process and lamina of L1-L5. We then obtained intraoperative radiograph to confirm our location. We then inserted the Verstrac retractor to provide exposure. I incised the interspinous ligament at L2-3 and L3-4 the scalpel. I used Leksell nodule were to remove the spinous process of L3 and the caudal aspect  of the L2 spinous process. We saved this bone for later use in the fusion.  I began the decompression by using the high speed drill to perform laminotomies at bilaterally at L3. We then used the Kerrison punches to widen the laminotomy and removed the ligamentum flavum at L2-3 and L3-4. There was severe spinal stenosis at L2-3. We encountered multiple holes in the patient's dura from the fracture. There was a large posterior dural tear with multiple nerve roots protruding. We replaced the nerve roots and the dural sac and closed the durotomy with a running 5-0 Prolene suture. We encountered a second dorsal dural tear which we closed with a 6-0 Prolene suture. We used the Kerrison punches to remove the medial facets at L3-4. We performed wide foraminotomies about the bilateral 3 and L4 nerve roots. Carefully dissected around the thecal sac. We used the Epstein and Scoville curettes to carefully push the bone fragments ventrally decompressing the ventral thecal sac. We noted another large dural tear ventrally in the thecal sac which we closed with a running 5-0 Prolene suture. We encountered another hole in the dura at the axilla of the L3 nerve root. We closed this with a figure of 8 6-0 Prolene suture. At this point we appeared to have a fairly good dural closure and did not note any more spinal fluid leaks. The thecal sac was well decompressed at this point. I placed a DuraSeal over the repaired durotomies.  We now turned attention to the instrumentation. Under fluoroscopic guidance we cannulated the bilateral L1, L2, L4 and L5 pedicles with the bone probe. We then removed the bone probe. We then tapped the pedicle with a 6.5 millimeter tap. We then removed the tap. We probed inside the tapped pedicle with a  ball probe to rule out cortical breaches. We then inserted a double 0.5 x 50 and 55 millimeter pedicle screw into the L1, L2, L4 and L5 pedicles bilaterally under fluoroscopic guidance. We then connected  the unilateral pedicle screws with a lordotic rod. We secured the pedicle screws to the rod tightening the caps. We placed a cross connector between the rods and secured it. This completed the instrumentation.  We now turned our attention to the posterior lateral arthrodesis at L1-2, L2-3, L3-4 and L4-5. We used the high-speed drill to decorticate the remainder of the facets, pars, transverse process at L1-2, L2-3, L3-4 and L4-5. We then applied a combination of local morselized autograft bone which we obtained during the decompression, bone morphogenic protein-soaked collagen sponges, and Kinnex bone graft extender over these decorticated posterior lateral structures. This completed the posterior lateral arthrodesis.  We then obtained hemostasis using bipolar electrocautery. We irrigated the wound out with bacitracin solution. We inspected the thecal sac and nerve roots and noted they were well decompressed. We then removed the retractor. We placed a medium Hemovac drain in the epidural space and tunneled out through separate stab wound. We reapproximated patient's thoracolumbar fascia with interrupted #1 Vicryl suture. We reapproximated patient's subcutaneous tissue with interrupted 2-0 Vicryl suture. The reapproximated patient's skin with Steri-Strips and benzoin. The wound was then coated with bacitracin ointment. A sterile dressing was applied. The drapes were removed. The patient was subsequently returned to the supine position where they were extubated by the anesthesia team. He was then transported to the post anesthesia care unit in stable condition. All sponge instrument and needle counts were reportedly correct at the end of this case.

## 2013-11-29 NOTE — Progress Notes (Signed)
Patient ID: Billy Collins, male   DOB: [birth year], 53 y.o.   MRN: [id number] Subjective:  The patient is alert and pleasant. He wants to proceed with surgery.  Objective: Vital signs in last 24 hours: Temp:  [98 F (36.7 C)-100 F (37.8 C)] 98 F (36.7 C) (09/12 0623) Pulse Rate:  [75-89] 75 (09/12 0623) Resp:  [18-20] 18 (09/12 0623) BP: (121-145)/(64-95) 121/64 mmHg (09/12 0623) SpO2:  [93 %-97 %] 96 % (09/12 0623) Weight:  [85.73 kg (189 lb)] 85.73 kg (189 lb) (09/12 0623)  Intake/Output from previous day: 09/11 0701 - 09/12 0700 In: -  Out: 1500 [Urine:1500] Intake/Output this shift:    Physical exam patient is alert and oriented x3. His strength is grossly normal his bilateral lower extremities.  Lab Results:  Recent Labs  11/27/13 2000 11/27/13 2016  WBC 14.8*  --   HGB 14.8 15.6  HCT 41.7 46.0  PLT 181  --    BMET  Recent Labs  11/27/13 2000 11/27/13 2016  NA 142 141  K 3.8 3.6*  CL 107 109  CO2 20  --   GLUCOSE 110* 110*  BUN 23 24*  CREATININE 0.97 1.00  CALCIUM 9.4  --     Studies/Results: Dg Tibia/fibula Right  11/27/2013   CLINICAL DATA:  Fall 16 feet out of a tree, right leg swelling  EXAM: RIGHT TIBIA AND FIBULA - 2 VIEW  COMPARISON:  None.  FINDINGS: Distal tibia and fibula are excluded.  No fracture or dislocation is seen.  The visualized soft tissues are unremarkable.  IMPRESSION: No fracture or dislocation is seen.   Electronically Signed   By: Charline Bills M.D.   On: 11/27/2013 21:37   Dg Ankle Complete Right  11/27/2013   CLINICAL DATA:  Patient fell 16 feet out of a tree. Right ankle pain and swelling.  EXAM: RIGHT ANKLE - COMPLETE 3+ VIEW  COMPARISON:  None.  FINDINGS: Prominent lateral soft tissue swelling over the right ankle. No evidence of acute fracture or dislocation. No focal bone lesion or bone destruction. Bone cortex and trabecular architecture appear intact. Plantar calcaneal spur is incidentally noted.  IMPRESSION: Lateral  soft tissue swelling. No acute bony abnormalities suggested appear   Electronically Signed   By: Burman Nieves M.D.   On: 11/27/2013 21:37   Ct Head Wo Contrast  11/27/2013   CLINICAL DATA:  Fall from tree stand  EXAM: CT HEAD WITHOUT CONTRAST  CT CERVICAL SPINE WITHOUT CONTRAST  TECHNIQUE: Multidetector CT imaging of the head and cervical spine was performed following the standard protocol without intravenous contrast. Multiplanar CT image reconstructions of the cervical spine were also generated.  COMPARISON:  None.  FINDINGS: CT HEAD FINDINGS  No evidence of parenchymal hemorrhage or extra-axial fluid collection. No mass lesion, mass effect, or midline shift.  No CT evidence of acute infarction.  Cerebral volume is within normal limits.  No ventriculomegaly.  Mucosal thickening in the bilateral ethmoid and maxillary sinuses. Partial opacification of the left sphenoid sinus. The mastoid air cells are clear.  No evidence of calvarial fracture.  CT CERVICAL SPINE FINDINGS  Reversal the normal cervical lordosis.  No evidence of fracture or dislocation. Vertebral body heights are maintained. Dens appears intact.  No prevertebral soft tissue swelling.  Moderate multilevel degenerative changes.  Visualized thyroid is unremarkable.  IMPRESSION: No evidence of acute intracranial abnormality.  No evidence of traumatic injury to the cervical spine. Moderate multilevel degenerative changes.   Electronically Signed  By: Charline Bills M.D.   On: 11/27/2013 20:47   Ct Chest W Contrast  11/27/2013   CLINICAL DATA:  Trauma  EXAM: CT CHEST, ABDOMEN, AND PELVIS WITH CONTRAST  TECHNIQUE: Multidetector CT imaging of the chest, abdomen and pelvis was performed following the standard protocol during bolus administration of intravenous contrast.  CONTRAST:  OMNIPAQUE IOHEXOL 300 MG/ML  SOLN  COMPARISON:  Prior radiographs from earlier the same day  FINDINGS: CT CHEST FINDINGS  Thyroid gland within normal limits.  No  mediastinal, hilar, or axillary adenopathy.  Intrathoracic aorta is of normal caliber without evidence of acute traumatic injury. Great vessels are intact. No mediastinal hematoma.  Heart size is normal.  Pulmonary arteries within normal limits.  No pneumothorax or pulmonary contusion. Subsegmental atelectasis seen dependently within the bilateral lower lobes. No focal infiltrates. No worrisome pulmonary nodule or mass.  No acute fracture within the thorax.  CT ABDOMEN AND PELVIS FINDINGS  The liver is intact. Gallbladder is normal. Spleen is intact. Adrenal glands within normal limits. Pancreas is unremarkable. Kidneys demonstrate a normal contrast enhanced appearance without evidence of acute abnormality. Single 3 mm nonobstructive left renal calculus noted.  Stomach is normal. No evidence of bowel obstruction or acute bowel injury. No acute inflammatory changes seen about the bowels. Few scattered colonic diverticula noted. Fat containing paraumbilical hernia noted.  Bladder prostate within normal limits.  No free intraperitoneal air.  The intra abdominal aorta is and its branch vessels are intact. Soft tissue stranding tracking adjacent to the left aspect of the infrarenal aorta is thought to be secondary to adjacent psoas hematoma.  No acute pelvic fracture.  There is an acute comminuted fracture through the anterior superior endplate of L1 with associated 25-30% of height loss. No significant retropulsion. Faint linear lucencies suggestive of nondisplaced fractures extending through the bilateral pedicles are suspected. No significant displacement.  There is a severe burst type fracture of the L3 vertebral body with approximately 60-70% of height loss. There is approximately 1.2 cm of retropulsion with resultant severe canal stenosis. The thecal sac measures approximately 3 mm in AP diameter. There is disruption of both the anterior and posterior columns. There is disruption of the posterior column as well as  there is an acute nondisplaced fracture of the right lamina of L3. There are is an associated fracture of the right transverse process of L3.  There is an associated hematoma within the left psoas musculature.  Curvilinear osseous density along the left lateral aspect of the L4 superior endplate is suspicious for a fractured osteophyte (series 3, image 81). The L4 superior endplate itself appears intact. There is question of additional small fractures through endplate osteophytes at the inferior endplate of L2 bilaterally.  IMPRESSION: 1. Acute burst type fracture involving the L3 vertebral body with associated 1.2 cm of retropulsion and resultant severe central canal stenosis. There is disruption of all 3 columns, consistent with an unstable fracture. 2. Acute compression fracture of L1 with associated 25-30% of height loss without retropulsion. 3. Left psoas hematoma. 4. Acute fracture through the right transverse process of L3. 5. Question fractured osteophyte adjacent to the left lateral aspect of the L4 superior endplate. 6. No other acute traumatic injury within the chest, abdomen, and pelvis. 7. 3 mm nonobstructive left renal calculus.  Critical Value/emergent results were called by telephone at the time of interpretation on 11/27/2013 at 8:49 pm to Dr. Zadie Rhine , who verbally acknowledged these results.   Electronically Signed  By: Rise Mu M.D.   On: 11/27/2013 21:17   Ct Cervical Spine Wo Contrast  11/27/2013   CLINICAL DATA:  Fall from tree stand  EXAM: CT HEAD WITHOUT CONTRAST  CT CERVICAL SPINE WITHOUT CONTRAST  TECHNIQUE: Multidetector CT imaging of the head and cervical spine was performed following the standard protocol without intravenous contrast. Multiplanar CT image reconstructions of the cervical spine were also generated.  COMPARISON:  None.  FINDINGS: CT HEAD FINDINGS  No evidence of parenchymal hemorrhage or extra-axial fluid collection. No mass lesion, mass effect, or  midline shift.  No CT evidence of acute infarction.  Cerebral volume is within normal limits.  No ventriculomegaly.  Mucosal thickening in the bilateral ethmoid and maxillary sinuses. Partial opacification of the left sphenoid sinus. The mastoid air cells are clear.  No evidence of calvarial fracture.  CT CERVICAL SPINE FINDINGS  Reversal the normal cervical lordosis.  No evidence of fracture or dislocation. Vertebral body heights are maintained. Dens appears intact.  No prevertebral soft tissue swelling.  Moderate multilevel degenerative changes.  Visualized thyroid is unremarkable.  IMPRESSION: No evidence of acute intracranial abnormality.  No evidence of traumatic injury to the cervical spine. Moderate multilevel degenerative changes.   Electronically Signed   By: Charline Bills M.D.   On: 11/27/2013 20:47   Ct Abdomen Pelvis W Contrast  11/27/2013   CLINICAL DATA:  Trauma  EXAM: CT CHEST, ABDOMEN, AND PELVIS WITH CONTRAST  TECHNIQUE: Multidetector CT imaging of the chest, abdomen and pelvis was performed following the standard protocol during bolus administration of intravenous contrast.  CONTRAST:  OMNIPAQUE IOHEXOL 300 MG/ML  SOLN  COMPARISON:  Prior radiographs from earlier the same day  FINDINGS: CT CHEST FINDINGS  Thyroid gland within normal limits.  No mediastinal, hilar, or axillary adenopathy.  Intrathoracic aorta is of normal caliber without evidence of acute traumatic injury. Great vessels are intact. No mediastinal hematoma.  Heart size is normal.  Pulmonary arteries within normal limits.  No pneumothorax or pulmonary contusion. Subsegmental atelectasis seen dependently within the bilateral lower lobes. No focal infiltrates. No worrisome pulmonary nodule or mass.  No acute fracture within the thorax.  CT ABDOMEN AND PELVIS FINDINGS  The liver is intact. Gallbladder is normal. Spleen is intact. Adrenal glands within normal limits. Pancreas is unremarkable. Kidneys demonstrate a normal  contrast enhanced appearance without evidence of acute abnormality. Single 3 mm nonobstructive left renal calculus noted.  Stomach is normal. No evidence of bowel obstruction or acute bowel injury. No acute inflammatory changes seen about the bowels. Few scattered colonic diverticula noted. Fat containing paraumbilical hernia noted.  Bladder prostate within normal limits.  No free intraperitoneal air.  The intra abdominal aorta is and its branch vessels are intact. Soft tissue stranding tracking adjacent to the left aspect of the infrarenal aorta is thought to be secondary to adjacent psoas hematoma.  No acute pelvic fracture.  There is an acute comminuted fracture through the anterior superior endplate of L1 with associated 25-30% of height loss. No significant retropulsion. Faint linear lucencies suggestive of nondisplaced fractures extending through the bilateral pedicles are suspected. No significant displacement.  There is a severe burst type fracture of the L3 vertebral body with approximately 60-70% of height loss. There is approximately 1.2 cm of retropulsion with resultant severe canal stenosis. The thecal sac measures approximately 3 mm in AP diameter. There is disruption of both the anterior and posterior columns. There is disruption of the posterior column as well as  there is an acute nondisplaced fracture of the right lamina of L3. There are is an associated fracture of the right transverse process of L3.  There is an associated hematoma within the left psoas musculature.  Curvilinear osseous density along the left lateral aspect of the L4 superior endplate is suspicious for a fractured osteophyte (series 3, image 81). The L4 superior endplate itself appears intact. There is question of additional small fractures through endplate osteophytes at the inferior endplate of L2 bilaterally.  IMPRESSION: 1. Acute burst type fracture involving the L3 vertebral body with associated 1.2 cm of retropulsion and  resultant severe central canal stenosis. There is disruption of all 3 columns, consistent with an unstable fracture. 2. Acute compression fracture of L1 with associated 25-30% of height loss without retropulsion. 3. Left psoas hematoma. 4. Acute fracture through the right transverse process of L3. 5. Question fractured osteophyte adjacent to the left lateral aspect of the L4 superior endplate. 6. No other acute traumatic injury within the chest, abdomen, and pelvis. 7. 3 mm nonobstructive left renal calculus.  Critical Value/emergent results were called by telephone at the time of interpretation on 11/27/2013 at 8:49 pm to Dr. Zadie Rhine , who verbally acknowledged these results.   Electronically Signed   By: Rise Mu M.D.   On: 11/27/2013 21:17   Dg Pelvis Portable  11/27/2013   CLINICAL DATA:  Fall  EXAM: PORTABLE PELVIS 1-2 VIEWS  COMPARISON:  None.  FINDINGS: No fracture or dislocation is seen.  Bilateral hip joints are preserved.  Visualized bony pelvis appears intact.  IMPRESSION: No fracture or dislocation is seen.   Electronically Signed   By: Charline Bills M.D.   On: 11/27/2013 20:24   Dg Chest Port 1 View  11/27/2013   CLINICAL DATA:  Larey Seat out of a tree stand landing feet first, low back and RIGHT ankle pain  EXAM: PORTABLE CHEST - 1 VIEW  COMPARISON:  Portable exam 2000 hr without priors for comparison.  FINDINGS: Low lung volumes.  Enlargement of cardiac silhouette and prominent mediastinum potentially accentuated by AP portable supine technique.  Pulmonary vascular congestion.  RIGHT basilar atelectasis and accentuation of perihilar markings.  No gross infiltrate, pleural effusion, pneumothorax, or fracture.  IMPRESSION: Low lung volumes with RIGHT basilar atelectasis and accentuated perihilar markings.  Prominent heart and mediastinum may be related to technique ; consider followup upright PA and lateral chest radiographs to better assess mediastinum.   Electronically Signed    By: Ulyses Southward M.D.   On: 11/27/2013 20:25   Dg Foot Complete Right  11/27/2013   CLINICAL DATA:  Larey Seat 16 feet out of a tree, RIGHT leg swelling at ankle  EXAM: RIGHT FOOT COMPLETE - 3+ VIEW  COMPARISON:  Portable exam 2118 hr without priors for comparison  FINDINGS: Exam slightly limited by positioning and portable technique.  Osseous mineralization grossly normal.  Joint spaces preserved.  Plantar calcaneal spur.  Soft tissue swelling lower leg.  No definite acute fracture, dislocation or bone destruction identified.  IMPRESSION: No definite acute osseous abnormalities within limitations as above.   Electronically Signed   By: Ulyses Southward M.D.   On: 11/27/2013 21:40    Assessment/Plan: L1 and L3 fractures, lumbago: I have answered all the patient's, and his family's, questions regarding the surgery. He wants to proceed with the operation.  LOS: 2 days     Yuriy Cui D 11/29/2013, 7:15 AM

## 2013-11-29 NOTE — Transfer of Care (Signed)
Immediate Anesthesia Transfer of Care Note  Patient: Billy Collins  Procedure(s) Performed: Procedure(s): LUMBAR LAMINECTOMY/DECOMPRESSION WITH INSTRUMENTATION FUSION L1-L5 (N/A)  Patient Location: PACU  Anesthesia Type:General  Level of Consciousness: awake, alert  and oriented  Airway & Oxygen Therapy: Patient Spontanous Breathing and Patient connected to face mask oxygen  Post-op Assessment: Report given to PACU RN, Post -op Vital signs reviewed and stable and Patient moving all extremities X 4  Post vital signs: Reviewed and stable  Complications: No apparent anesthesia complications

## 2013-11-29 NOTE — Anesthesia Procedure Notes (Signed)
Procedure Name: Intubation Date/Time: 11/29/2013 8:32 AM Performed by: Reine Just Pre-anesthesia Checklist: Patient identified, Emergency Drugs available, Suction available, Patient being monitored and Timeout performed Patient Re-evaluated:Patient Re-evaluated prior to inductionOxygen Delivery Method: Circle system utilized and Simple face mask Preoxygenation: Pre-oxygenation with 100% oxygen Intubation Type: IV induction Ventilation: Mask ventilation without difficulty Laryngoscope Size: Miller and 3 Grade View: Grade II Tube type: Oral Tube size: 7.5 mm Number of attempts: 1 Airway Equipment and Method: Patient positioned with wedge pillow and Stylet Placement Confirmation: ETT inserted through vocal cords under direct vision,  positive ETCO2 and breath sounds checked- equal and bilateral Secured at: 22 cm Tube secured with: Tape Dental Injury: Teeth and Oropharynx as per pre-operative assessment

## 2013-11-29 NOTE — Progress Notes (Signed)
Subjective:  The patient is somnolent but arousable. He is in no apparent distress.  Objective: Vital signs in last 24 hours: Temp:  [98 F (36.7 C)-99.5 F (37.5 C)] 99.5 F (37.5 C) (09/12 1444) Pulse Rate:  [75-116] 116 (09/12 1444) Resp:  [14-20] 14 (09/12 1444) BP: (121-140)/(64-95) 136/86 mmHg (09/12 1444) SpO2:  [93 %-99 %] 99 % (09/12 1444) Weight:  [85.73 kg (189 lb)] 85.73 kg (189 lb) (09/12 0623)  Intake/Output from previous day: 09/11 0701 - 09/12 0700 In: -  Out: 1500 [Urine:1500] Intake/Output this shift: Total I/O In: 3700 [I.V.:3700] Out: 950 [Urine:600; Blood:350]  Physical exam the patient is somnolent but arousable. He is moving his lower extremities well.  Lab Results:  Recent Labs  11/27/13 2000 11/27/13 2016  WBC 14.8*  --   HGB 14.8 15.6  HCT 41.7 46.0  PLT 181  --    BMET  Recent Labs  11/27/13 2000 11/27/13 2016  NA 142 141  K 3.8 3.6*  CL 107 109  CO2 20  --   GLUCOSE 110* 110*  BUN 23 24*  CREATININE 0.97 1.00  CALCIUM 9.4  --     Studies/Results: Dg Lumbar Spine 2-3 Views  11/29/2013   CLINICAL DATA:  PLIF of the lumbar spine  EXAM: LUMBAR SPINE - 2-3 VIEW  COMPARISON:  11/27/2013  FINDINGS: Portable intraoperative radiographs obtained via C-arm radiography shows placement of pedicle screws at L1, L2, L4 and L5. The burst fracture at L3 is again identified.  IMPRESSION: 1. Portable radiographs demonstrate pedicle screw placement at L1, L2, L4 and L5. 2. L3 burst fracture.   Electronically Signed   By: Signa Kell M.D.   On: 11/29/2013 13:14   Ct Head Wo Contrast  11/27/2013   CLINICAL DATA:  Fall from tree stand  EXAM: CT HEAD WITHOUT CONTRAST  CT CERVICAL SPINE WITHOUT CONTRAST  TECHNIQUE: Multidetector CT imaging of the head and cervical spine was performed following the standard protocol without intravenous contrast. Multiplanar CT image reconstructions of the cervical spine were also generated.  COMPARISON:  None.  FINDINGS:  CT HEAD FINDINGS  No evidence of parenchymal hemorrhage or extra-axial fluid collection. No mass lesion, mass effect, or midline shift.  No CT evidence of acute infarction.  Cerebral volume is within normal limits.  No ventriculomegaly.  Mucosal thickening in the bilateral ethmoid and maxillary sinuses. Partial opacification of the left sphenoid sinus. The mastoid air cells are clear.  No evidence of calvarial fracture.  CT CERVICAL SPINE FINDINGS  Reversal the normal cervical lordosis.  No evidence of fracture or dislocation. Vertebral body heights are maintained. Dens appears intact.  No prevertebral soft tissue swelling.  Moderate multilevel degenerative changes.  Visualized thyroid is unremarkable.  IMPRESSION: No evidence of acute intracranial abnormality.  No evidence of traumatic injury to the cervical spine. Moderate multilevel degenerative changes.   Electronically Signed   By: Charline Bills M.D.   On: 11/27/2013 20:47   Ct Chest W Contrast  11/27/2013   CLINICAL DATA:  Trauma  EXAM: CT CHEST, ABDOMEN, AND PELVIS WITH CONTRAST  TECHNIQUE: Multidetector CT imaging of the chest, abdomen and pelvis was performed following the standard protocol during bolus administration of intravenous contrast.  CONTRAST:  OMNIPAQUE IOHEXOL 300 MG/ML  SOLN  COMPARISON:  Prior radiographs from earlier the same day  FINDINGS: CT CHEST FINDINGS  Thyroid gland within normal limits.  No mediastinal, hilar, or axillary adenopathy.  Intrathoracic aorta is of normal caliber without  evidence of acute traumatic injury. Great vessels are intact. No mediastinal hematoma.  Heart size is normal.  Pulmonary arteries within normal limits.  No pneumothorax or pulmonary contusion. Subsegmental atelectasis seen dependently within the bilateral lower lobes. No focal infiltrates. No worrisome pulmonary nodule or mass.  No acute fracture within the thorax.  CT ABDOMEN AND PELVIS FINDINGS  The liver is intact. Gallbladder is normal.  Spleen is intact. Adrenal glands within normal limits. Pancreas is unremarkable. Kidneys demonstrate a normal contrast enhanced appearance without evidence of acute abnormality. Single 3 mm nonobstructive left renal calculus noted.  Stomach is normal. No evidence of bowel obstruction or acute bowel injury. No acute inflammatory changes seen about the bowels. Few scattered colonic diverticula noted. Fat containing paraumbilical hernia noted.  Bladder prostate within normal limits.  No free intraperitoneal air.  The intra abdominal aorta is and its branch vessels are intact. Soft tissue stranding tracking adjacent to the left aspect of the infrarenal aorta is thought to be secondary to adjacent psoas hematoma.  No acute pelvic fracture.  There is an acute comminuted fracture through the anterior superior endplate of L1 with associated 25-30% of height loss. No significant retropulsion. Faint linear lucencies suggestive of nondisplaced fractures extending through the bilateral pedicles are suspected. No significant displacement.  There is a severe burst type fracture of the L3 vertebral body with approximately 60-70% of height loss. There is approximately 1.2 cm of retropulsion with resultant severe canal stenosis. The thecal sac measures approximately 3 mm in AP diameter. There is disruption of both the anterior and posterior columns. There is disruption of the posterior column as well as there is an acute nondisplaced fracture of the right lamina of L3. There are is an associated fracture of the right transverse process of L3.  There is an associated hematoma within the left psoas musculature.  Curvilinear osseous density along the left lateral aspect of the L4 superior endplate is suspicious for a fractured osteophyte (series 3, image 81). The L4 superior endplate itself appears intact. There is question of additional small fractures through endplate osteophytes at the inferior endplate of L2 bilaterally.   IMPRESSION: 1. Acute burst type fracture involving the L3 vertebral body with associated 1.2 cm of retropulsion and resultant severe central canal stenosis. There is disruption of all 3 columns, consistent with an unstable fracture. 2. Acute compression fracture of L1 with associated 25-30% of height loss without retropulsion. 3. Left psoas hematoma. 4. Acute fracture through the right transverse process of L3. 5. Question fractured osteophyte adjacent to the left lateral aspect of the L4 superior endplate. 6. No other acute traumatic injury within the chest, abdomen, and pelvis. 7. 3 mm nonobstructive left renal calculus.  Critical Value/emergent results were called by telephone at the time of interpretation on 11/27/2013 at 8:49 pm to Dr. Zadie Rhine , who verbally acknowledged these results.   Electronically Signed   By: Rise Mu M.D.   On: 11/27/2013 21:17   Ct Cervical Spine Wo Contrast  11/27/2013   CLINICAL DATA:  Fall from tree stand  EXAM: CT HEAD WITHOUT CONTRAST  CT CERVICAL SPINE WITHOUT CONTRAST  TECHNIQUE: Multidetector CT imaging of the head and cervical spine was performed following the standard protocol without intravenous contrast. Multiplanar CT image reconstructions of the cervical spine were also generated.  COMPARISON:  None.  FINDINGS: CT HEAD FINDINGS  No evidence of parenchymal hemorrhage or extra-axial fluid collection. No mass lesion, mass effect, or midline shift.  No CT  evidence of acute infarction.  Cerebral volume is within normal limits.  No ventriculomegaly.  Mucosal thickening in the bilateral ethmoid and maxillary sinuses. Partial opacification of the left sphenoid sinus. The mastoid air cells are clear.  No evidence of calvarial fracture.  CT CERVICAL SPINE FINDINGS  Reversal the normal cervical lordosis.  No evidence of fracture or dislocation. Vertebral body heights are maintained. Dens appears intact.  No prevertebral soft tissue swelling.  Moderate multilevel  degenerative changes.  Visualized thyroid is unremarkable.  IMPRESSION: No evidence of acute intracranial abnormality.  No evidence of traumatic injury to the cervical spine. Moderate multilevel degenerative changes.   Electronically Signed   By: Charline Bills M.D.   On: 11/27/2013 20:47   Ct Abdomen Pelvis W Contrast  11/27/2013   CLINICAL DATA:  Trauma  EXAM: CT CHEST, ABDOMEN, AND PELVIS WITH CONTRAST  TECHNIQUE: Multidetector CT imaging of the chest, abdomen and pelvis was performed following the standard protocol during bolus administration of intravenous contrast.  CONTRAST:  OMNIPAQUE IOHEXOL 300 MG/ML  SOLN  COMPARISON:  Prior radiographs from earlier the same day  FINDINGS: CT CHEST FINDINGS  Thyroid gland within normal limits.  No mediastinal, hilar, or axillary adenopathy.  Intrathoracic aorta is of normal caliber without evidence of acute traumatic injury. Great vessels are intact. No mediastinal hematoma.  Heart size is normal.  Pulmonary arteries within normal limits.  No pneumothorax or pulmonary contusion. Subsegmental atelectasis seen dependently within the bilateral lower lobes. No focal infiltrates. No worrisome pulmonary nodule or mass.  No acute fracture within the thorax.  CT ABDOMEN AND PELVIS FINDINGS  The liver is intact. Gallbladder is normal. Spleen is intact. Adrenal glands within normal limits. Pancreas is unremarkable. Kidneys demonstrate a normal contrast enhanced appearance without evidence of acute abnormality. Single 3 mm nonobstructive left renal calculus noted.  Stomach is normal. No evidence of bowel obstruction or acute bowel injury. No acute inflammatory changes seen about the bowels. Few scattered colonic diverticula noted. Fat containing paraumbilical hernia noted.  Bladder prostate within normal limits.  No free intraperitoneal air.  The intra abdominal aorta is and its branch vessels are intact. Soft tissue stranding tracking adjacent to the left aspect of the  infrarenal aorta is thought to be secondary to adjacent psoas hematoma.  No acute pelvic fracture.  There is an acute comminuted fracture through the anterior superior endplate of L1 with associated 25-30% of height loss. No significant retropulsion. Faint linear lucencies suggestive of nondisplaced fractures extending through the bilateral pedicles are suspected. No significant displacement.  There is a severe burst type fracture of the L3 vertebral body with approximately 60-70% of height loss. There is approximately 1.2 cm of retropulsion with resultant severe canal stenosis. The thecal sac measures approximately 3 mm in AP diameter. There is disruption of both the anterior and posterior columns. There is disruption of the posterior column as well as there is an acute nondisplaced fracture of the right lamina of L3. There are is an associated fracture of the right transverse process of L3.  There is an associated hematoma within the left psoas musculature.  Curvilinear osseous density along the left lateral aspect of the L4 superior endplate is suspicious for a fractured osteophyte (series 3, image 81). The L4 superior endplate itself appears intact. There is question of additional small fractures through endplate osteophytes at the inferior endplate of L2 bilaterally.  IMPRESSION: 1. Acute burst type fracture involving the L3 vertebral body with associated 1.2 cm of  retropulsion and resultant severe central canal stenosis. There is disruption of all 3 columns, consistent with an unstable fracture. 2. Acute compression fracture of L1 with associated 25-30% of height loss without retropulsion. 3. Left psoas hematoma. 4. Acute fracture through the right transverse process of L3. 5. Question fractured osteophyte adjacent to the left lateral aspect of the L4 superior endplate. 6. No other acute traumatic injury within the chest, abdomen, and pelvis. 7. 3 mm nonobstructive left renal calculus.  Critical Value/emergent  results were called by telephone at the time of interpretation on 11/27/2013 at 8:49 pm to Dr. Zadie Rhine , who verbally acknowledged these results.   Electronically Signed   By: Rise Mu M.D.   On: 11/27/2013 21:17   Dg Pelvis Portable  11/27/2013   CLINICAL DATA:  Fall  EXAM: PORTABLE PELVIS 1-2 VIEWS  COMPARISON:  None.  FINDINGS: No fracture or dislocation is seen.  Bilateral hip joints are preserved.  Visualized bony pelvis appears intact.  IMPRESSION: No fracture or dislocation is seen.   Electronically Signed   By: Charline Bills M.D.   On: 11/27/2013 20:24   Dg Chest Port 1 View  11/27/2013   CLINICAL DATA:  Larey Seat out of a tree stand landing feet first, low back and RIGHT ankle pain  EXAM: PORTABLE CHEST - 1 VIEW  COMPARISON:  Portable exam 2000 hr without priors for comparison.  FINDINGS: Low lung volumes.  Enlargement of cardiac silhouette and prominent mediastinum potentially accentuated by AP portable supine technique.  Pulmonary vascular congestion.  RIGHT basilar atelectasis and accentuation of perihilar markings.  No gross infiltrate, pleural effusion, pneumothorax, or fracture.  IMPRESSION: Low lung volumes with RIGHT basilar atelectasis and accentuated perihilar markings.  Prominent heart and mediastinum may be related to technique ; consider followup upright PA and lateral chest radiographs to better assess mediastinum.   Electronically Signed   By: Ulyses Southward M.D.   On: 11/27/2013 20:25    Assessment/Plan: The patient appears to be doing well. I spoke with his family. We will keep his head of bed less than 20 because of his multiple dural tears from the fracture  LOS: 2 days     Nakea Gouger D 11/29/2013, 3:10 PM

## 2013-11-29 NOTE — Anesthesia Preprocedure Evaluation (Addendum)
Anesthesia Evaluation  Patient identified by MRN, date of birth, ID band Patient awake    Reviewed: Allergy & Precautions, H&P , NPO status , Patient's Chart, lab work & pertinent test results  History of Anesthesia Complications Negative for: history of anesthetic complications  Airway Mallampati: II  Neck ROM: Full    Dental  (+) Teeth Intact, Dental Advisory Given   Pulmonary neg pulmonary ROS,  breath sounds clear to auscultation        Cardiovascular negative cardio ROS  Rhythm:Regular Rate:Normal     Neuro/Psych L1 and L3 burst fractures   C-spine cleared    GI/Hepatic negative GI ROS, (+)     substance abuse (7-8 beers daily)  alcohol use,   Endo/Other  negative endocrine ROS  Renal/GU negative Renal ROS     Musculoskeletal   Abdominal   Peds  Hematology   Anesthesia Other Findings   Reproductive/Obstetrics                           Anesthesia Physical Anesthesia Plan  ASA: II  Anesthesia Plan: General   Post-op Pain Management:    Induction: Intravenous  Airway Management Planned: Oral ETT  Additional Equipment:   Intra-op Plan:   Post-operative Plan: Extubation in OR  Informed Consent: I have reviewed the patients History and Physical, chart, labs and discussed the procedure including the risks, benefits and alternatives for the proposed anesthesia with the patient or authorized representative who has indicated his/her understanding and acceptance.   Dental advisory given  Plan Discussed with: CRNA and Surgeon  Anesthesia Plan Comments: (Plan routine monitors, GETA)        Anesthesia Quick Evaluation

## 2013-11-29 NOTE — Anesthesia Postprocedure Evaluation (Signed)
  Anesthesia Post-op Note  Patient: Billy Collins  Procedure(s) Performed: Procedure(s): LUMBAR LAMINECTOMY/DECOMPRESSION WITH INSTRUMENTATION FUSION L1-L5 (N/A)  Patient Location: PACU  Anesthesia Type:General  Level of Consciousness: awake, alert , oriented and patient cooperative  Airway and Oxygen Therapy: Patient Spontanous Breathing and Patient connected to nasal cannula oxygen  Post-op Pain: mild  Post-op Assessment: Post-op Vital signs reviewed, Patient's Cardiovascular Status Stable, Respiratory Function Stable, Patent Airway, No signs of Nausea or vomiting and Pain level controlled  Post-op Vital Signs: Reviewed and stable  Last Vitals:  Filed Vitals:   11/29/13 1600  BP: 114/78  Pulse: 78  Temp: 36.7 C  Resp: 15    Complications: No apparent anesthesia complications

## 2013-11-29 NOTE — Progress Notes (Signed)
Pt states groin area has decreased sensation.

## 2013-11-30 ENCOUNTER — Encounter (HOSPITAL_COMMUNITY): Payer: Self-pay | Admitting: *Deleted

## 2013-11-30 LAB — CBC
HEMATOCRIT: 32 % — AB (ref 39.0–52.0)
Hemoglobin: 11.1 g/dL — ABNORMAL LOW (ref 13.0–17.0)
MCH: 31.9 pg (ref 26.0–34.0)
MCHC: 34.7 g/dL (ref 30.0–36.0)
MCV: 92 fL (ref 78.0–100.0)
Platelets: 148 10*3/uL — ABNORMAL LOW (ref 150–400)
RBC: 3.48 MIL/uL — AB (ref 4.22–5.81)
RDW: 12.4 % (ref 11.5–15.5)
WBC: 12.1 10*3/uL — AB (ref 4.0–10.5)

## 2013-11-30 LAB — BASIC METABOLIC PANEL
Anion gap: 11 (ref 5–15)
BUN: 17 mg/dL (ref 6–23)
CHLORIDE: 104 meq/L (ref 96–112)
CO2: 24 meq/L (ref 19–32)
Calcium: 8.7 mg/dL (ref 8.4–10.5)
Creatinine, Ser: 0.77 mg/dL (ref 0.50–1.35)
GFR calc non Af Amer: >90 mL/min (ref >90)
Glucose, Bld: 117 mg/dL — ABNORMAL HIGH (ref 70–99)
Potassium: 4.4 mEq/L (ref 3.7–5.3)
Sodium: 139 mEq/L (ref 137–147)

## 2013-11-30 MED ORDER — PANTOPRAZOLE SODIUM 40 MG PO TBEC
40.0000 mg | DELAYED_RELEASE_TABLET | Freq: Every day | ORAL | Status: DC
  Administered 2013-12-01 – 2013-12-04 (×4): 40 mg via ORAL
  Filled 2013-11-30 (×4): qty 1

## 2013-11-30 NOTE — Progress Notes (Signed)
Patient ID: Billy Collins, male   DOB: Feb 11, 1961, 53 y.o.   MRN: [id number] Subjective: Patient reports appropriate back soreness. No leg pain or numbness tingling or weakness. No headache  Objective: Vital signs in last 24 hours: Temp:  [98 F (36.7 C)-99.5 F (37.5 C)] 98.5 F (36.9 C) (09/13 0945) Pulse Rate:  [76-116] 80 (09/13 0945) Resp:  [14-19] 18 (09/13 0945) BP: (105-144)/(58-86) 121/76 mmHg (09/13 0945) SpO2:  [92 %-99 %] 95 % (09/13 0945)  Intake/Output from previous day: 09/12 0701 - 09/13 0700 In: 3785 [I.V.:3785] Out: 2605 [Urine:1900; Drains:355; Blood:350] Intake/Output this shift: Total I/O In: 240 [P.O.:240] Out: 150 [Drains:150]  Neurologic: Grossly normal  Lab Results: Lab Results  Component Value Date   WBC 12.1* 11/30/2013   HGB 11.1* 11/30/2013   HCT 32.0* 11/30/2013   MCV 92.0 11/30/2013   PLT 148* 11/30/2013   Lab Results  Component Value Date   INR 1.02 11/27/2013   BMET Lab Results  Component Value Date   NA 139 11/30/2013   K 4.4 11/30/2013   CL 104 11/30/2013   CO2 24 11/30/2013   GLUCOSE 117* 11/30/2013   BUN 17 11/30/2013   CREATININE 0.77 11/30/2013   CALCIUM 8.7 11/30/2013    Studies/Results: Dg Lumbar Spine 2-3 Views  11/29/2013   CLINICAL DATA:  PLIF of the lumbar spine  EXAM: LUMBAR SPINE - 2-3 VIEW  COMPARISON:  11/27/2013  FINDINGS: Portable intraoperative radiographs obtained via C-arm radiography shows placement of pedicle screws at L1, L2, L4 and L5. The burst fracture at L3 is again identified.  IMPRESSION: 1. Portable radiographs demonstrate pedicle screw placement at L1, L2, L4 and L5. 2. L3 burst fracture.   Electronically Signed   By: Signa Kell M.D.   On: 11/29/2013 13:14   Dg C-arm 61-120 Min  11/30/2013   CLINICAL DATA:  PLIF of the lumbar spine  EXAM: LUMBAR SPINE - 2-3 VIEW  COMPARISON:  11/27/2013  FINDINGS: Portable intraoperative radiographs obtained via C-arm radiography shows placement of pedicle screws at L1, L2,  L4 and L5. The burst fracture at L3 is again identified.  IMPRESSION: 1. Portable radiographs demonstrate pedicle screw placement at L1, L2, L4 and L5. 2. L3 burst fracture.   Electronically Signed   By: Signa Kell M.D.   On: 11/29/2013 13:14    Assessment/Plan: Seems to be doing well. Continue flat bedrest for one more day. Awaiting a TLSO   LOS: 3 days    Brewster Wolters S 11/30/2013, 11:16 AM

## 2013-11-30 NOTE — Progress Notes (Signed)
Made Dr Lovell Sheehan aware of 490cc removal of blood from hemovac throughout today. Pt does not c/o of headache. Was given order to remove hemovac. Pt tolerated procedure well. Will continue to monitor.

## 2013-12-01 MED ORDER — BISACODYL 10 MG RE SUPP
10.0000 mg | Freq: Every day | RECTAL | Status: DC | PRN
  Filled 2013-12-01: qty 1

## 2013-12-01 MED ORDER — POLYETHYLENE GLYCOL 3350 17 G PO PACK
17.0000 g | PACK | Freq: Every day | ORAL | Status: DC
  Administered 2013-12-01 – 2013-12-05 (×5): 17 g via ORAL
  Filled 2013-12-01 (×5): qty 1

## 2013-12-01 MED ORDER — FLEET ENEMA 7-19 GM/118ML RE ENEM: 1.0000 | ENEMA | Freq: Every day | RECTAL | Status: DC | PRN

## 2013-12-01 MED ORDER — BISACODYL 10 MG RE SUPP
10.0000 mg | Freq: Every day | RECTAL | Status: DC | PRN
  Administered 2013-12-01 – 2013-12-04 (×2): 10 mg via RECTAL
  Filled 2013-12-01: qty 1

## 2013-12-01 MED FILL — Heparin Sodium (Porcine) Inj 1000 Unit/ML: INTRAMUSCULAR | Qty: 30 | Status: AC

## 2013-12-01 MED FILL — Sodium Chloride IV Soln 0.9%: INTRAVENOUS | Qty: 1000 | Status: AC

## 2013-12-01 NOTE — Progress Notes (Signed)
Patient ID: Billy Collins, male   DOB: [birth year], 53 y.o.   MRN: [id number] Subjective:  The patient is alert and pleasant. He looks well. He complains of constipation. He denies headaches.  Objective: Vital signs in last 24 hours: Temp:  [98.5 F (36.9 C)-99.4 F (37.4 C)] 98.9 F (37.2 C) (09/14 0930) Pulse Rate:  [73-83] 81 (09/14 0930) Resp:  [18] 18 (09/14 0930) BP: (107-142)/(64-95) 142/95 mmHg (09/14 0930) SpO2:  [95 %-98 %] 95 % (09/14 0930)  Intake/Output from previous day: 09/13 0701 - 09/14 0700 In: 720 [P.O.:720] Out: 1160 [Urine:850; Drains:310] Intake/Output this shift: Total I/O In: -  Out: 1000 [Urine:1000]  Physical exam the patient is alert and pleasant. His strength is grossly normal his bilateral gastrocnemius and dorsiflexors. His dressing is clean and dry.  Lab Results:  Recent Labs  11/30/13 0803  WBC 12.1*  HGB 11.1*  HCT 32.0*  PLT 148*   BMET  Recent Labs  11/30/13 0803  NA 139  K 4.4  CL 104  CO2 24  GLUCOSE 117*  BUN 17  CREATININE 0.77  CALCIUM 8.7    Studies/Results: No results found.  Assessment/Plan: Postop day #2: The patient is doing well. We will raise his head of bed to 45. We may be able to mobilize him tomorrow.  LOS: 4 days     Rossetta Kama D 12/01/2013, 1:42 PM

## 2013-12-01 NOTE — Progress Notes (Signed)
UR complete.  Lindell Tussey RN, MSN 

## 2013-12-02 NOTE — Progress Notes (Signed)
Patient ID: Billy Collins, male   DOB: 07-19-1960, 53 y.o.   MRN: [id number] Subjective:  The patient is alert and pleasant. He looks and feels much better than yesterday. He denies headaches.  Objective: Vital signs in last 24 hours: Temp:  [98.4 F (36.9 C)-100 F (37.8 C)] 100 F (37.8 C) (09/15 0127) Pulse Rate:  [81-103] 86 (09/15 0127) Resp:  [18] 18 (09/15 0127) BP: (113-142)/(64-95) 121/84 mmHg (09/15 0127) SpO2:  [95 %-99 %] 97 % (09/15 0127)  Intake/Output from previous day: 09/14 0701 - 09/15 0700 In: 540 [P.O.:540] Out: 2000 [Urine:2000] Intake/Output this shift:    Physical exam the patient is alert and oriented. His dressing is clean and dry. His strength is grossly normal in his bilateral gastrocnemius and dorsiflexors.  Lab Results:  Recent Labs  11/30/13 0803  WBC 12.1*  HGB 11.1*  HCT 32.0*  PLT 148*   BMET  Recent Labs  11/30/13 0803  NA 139  K 4.4  CL 104  CO2 24  GLUCOSE 117*  BUN 17  CREATININE 0.77  CALCIUM 8.7    Studies/Results: No results found.  Assessment/Plan: Postop day #3: The patient is progressing well. We will mobilize him today. We will discontinue his Foley catheter.  LOS: 5 days     Seirra Kos D 12/02/2013, 7:27 AM

## 2013-12-02 NOTE — Evaluation (Signed)
Physical Therapy Evaluation Patient Details Name: RAJEEV ESCUE MRN: [id number] DOB: 06/23/1960 Today's Date: 12/02/2013   History of Present Illness  Pt admitted after falling from a deer stand sustaining L1 and L3 burst fractures.  S/P: L3 laminectomy /foraminotomies to decompress the bilateral L3 and L4 nerve roots; open reduction of L3 burst fracture; posterior lateral arthrodesis at L1-2, L2-3, L3-4 and L4-5 with local morselized autograft bone and morphogenic protein, Bone graft extender; posterior segmental instrumentation with globus titanium pedicle screws and rods from L1-L5.  Clinical Impression  Pt admitted with/for lumbar fusion surgery due to burst fx's.  Pt currently limited functionally due to the problems listed below.  (see problems list.)  Pt will benefit from PT to maximize function and safety to be able to get home safely with available assist of family.     Follow Up Recommendations No PT follow up;Supervision for mobility/OOB    Equipment Recommendations  Rolling walker with 5" wheels;3in1 (PT)    Recommendations for Other Services       Precautions / Restrictions Precautions Precautions: Back Required Braces or Orthoses: Spinal Brace Spinal Brace: Thoracolumbosacral orthotic;Applied in sitting position      Mobility  Bed Mobility Overal bed mobility: Needs Assistance Bed Mobility: Rolling;Sidelying to Sit Rolling: Min guard Sidelying to sit: Min assist       General bed mobility comments: cues for technique and truncal assist  Transfers Overall transfer level: Needs assistance Equipment used: Rolling walker (2 wheeled) Transfers: Sit to/from Stand Sit to Stand: Min assist         General transfer comment: cues for hand placement; lift assist to get past the mid point (with shooting pain)  Ambulation/Gait Ambulation/Gait assistance: Min assist Ambulation Distance (Feet): 25 Feet Assistive device: Rolling walker (2 wheeled) Gait  Pattern/deviations: Step-through pattern;Trunk flexed;Shuffle     General Gait Details: mild knee buckling through out gait cycle with heavy use of RW.  Stairs            Wheelchair Mobility    Modified Rankin (Stroke Patients Only)       Balance Overall balance assessment: Needs assistance Sitting-balance support: Feet supported;No upper extremity supported;Single extremity supported Sitting balance-Leahy Scale: Fair Sitting balance - Comments: pain limiting unassisted sitting   Standing balance support: No upper extremity supported Standing balance-Leahy Scale: Fair Standing balance comment: pain limiting                              Pertinent Vitals/Pain Pain Assessment: 0-10 Pain Score: 7  Pain Location: back and radiating down logs Pain Descriptors / Indicators: Constant;Shooting Pain Intervention(s): Limited activity within patient's tolerance;RN gave pain meds during session    Home Living Family/patient expects to be discharged to:: Private residence Living Arrangements: Spouse/significant other Available Help at Discharge: Family;Available PRN/intermittently (son and girfriend as much as they can provide) Type of Home: House Home Access: Stairs to enter Entrance Stairs-Rails: Right;Left Entrance Stairs-Number of Steps: several Home Layout: One level Home Equipment: None      Prior Function Level of Independence: Independent         Comments: works in Horticulturist, 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     [employer]        Extremity/Trunk Assessment   Upper Extremity Assessment: Defer to OT evaluation           Lower Extremity Assessment: Overall WFL for tasks assessed;Generalized weakness (weakness due to pain)  Communication   Communication: No difficulties  Cognition Arousal/Alertness: Awake/alert Behavior During Therapy: WFL for tasks assessed/performed Overall Cognitive Status: Within Functional Limits for tasks  assessed                      General Comments General comments (skin integrity, edema, etc.): Educated pt on back care/prec, log roll/bed mobility technique, lifting restrictions, bracking issues (donning), and progression of activity.    Exercises        Assessment/Plan    PT Assessment Patient needs continued PT services  PT Diagnosis Difficulty walking;Generalized weakness;Acute pain   PT Problem List Decreased strength;Decreased activity tolerance;Decreased mobility;Decreased knowledge of use of DME;Decreased knowledge of precautions;Pain  PT Treatment Interventions DME instruction;Gait training;Stair training;Functional mobility training;Therapeutic activities;Patient/family education   PT Goals (Current goals can be found in the Care Plan section) Acute Rehab PT Goals Patient Stated Goal: back home/ back to work PT Goal Formulation: With patient Time For Goal Achievement: 12/09/13 Potential to Achieve Goals: Good    Frequency Min 5X/week   Barriers to discharge        Co-evaluation               End of Session Equipment Utilized During Treatment: Back brace Activity Tolerance: Patient tolerated treatment well;Patient limited by pain Patient left: in chair;with call bell/phone within reach;with nursing/sitter in room Nurse Communication: Mobility status         Time: 1610-9604 PT Time Calculation (min): 38 min   Charges:   PT Evaluation $Initial PT Evaluation Tier I: 1 Procedure PT Treatments $Gait Training: 8-22 mins $Self Care/Home Management: 8-22   PT G Codes:          Cynde Menard, Eliseo Gum 12/02/2013, 11:07 AM 12/02/2013  Hazen Bing, PT [phone]  (pager)

## 2013-12-02 NOTE — Evaluation (Signed)
Occupational Therapy Evaluation Patient Details Name: Billy Collins MRN: [id number] DOB: 1961-03-02 Today's Date: 12/02/2013    History of Present Illness Pt admitted after falling from a deer stand sustaining L1 and L3 burst fractures.  S/P: L3 laminectomy /foraminotomies to decompress the bilateral L3 and L4 nerve roots; open reduction of L3 burst fracture; posterior lateral arthrodesis at L1-2, L2-3, L3-4 and L4-5 with local morselized autograft bone and morphogenic protein, Bone graft extender; posterior segmental instrumentation with globus titanium pedicle screws and rods from L1-L5.   Clinical Impression    Pt s/p above. Pt independent with ADLs, PTA. Feel pt will benefit from acute OT to increase independence prior to d/c.     Follow Up Recommendations  No OT follow up;Supervision - Intermittent    Equipment Recommendations  3 in 1 bedside comode;Other (comment) (AE)    Recommendations for Other Services       Precautions / Restrictions Precautions Precautions: Back Precaution Comments: Reviewed precautions Required Braces or Orthoses: Spinal Brace Spinal Brace: Thoracolumbosacral orthotic;Applied in sitting position Restrictions Weight Bearing Restrictions: No      Mobility Bed Mobility Overal bed mobility: Needs Assistance Bed Mobility: Rolling;Sit to Sidelying Rolling: Min assist     Sit to sidelying: Min assist General bed mobility comments: cues for technique.  Transfers Overall transfer level: Needs assistance Equipment used: Rolling walker (2 wheeled) Transfers: Sit to/from Stand Sit to Stand: Min guard         General transfer comment: cues for technique.         ADL Overall ADL's : Needs assistance/impaired                 Upper Body Dressing : Sitting;Moderate assistance   Lower Body Dressing: Minimal assistance;With adaptive equipment;Sit to/from stand   Toilet Transfer: Min guard;Ambulation;RW (chair)   Toileting- Clothing  Manipulation and Hygiene: Min guard;Sit to/from stand       Functional mobility during ADLs: Min guard;Rolling walker General ADL Comments: Educated on use of cup for teeth care and placement of grooming items to avoid breaking precauitons. Educated on AE for LB ADLs. Discussed safe shoewear, use of bag on walker, and rugs. Recommended sitting for most of LB ADLs. Educated on 3 in 1. Educated on back brace.     Vision                     Perception     Praxis      Pertinent Vitals/Pain Pain Assessment: 0-10 Pain Score: 5  Pain Location: back Pain Descriptors / Indicators: Aching Pain Intervention(s): Monitored during session;Repositioned     Hand Dominance Right   Extremity/Trunk Assessment Upper Extremity Assessment Upper Extremity Assessment: Overall WFL for tasks assessed   Lower Extremity Assessment Lower Extremity Assessment: Defer to PT evaluation       Communication Communication Communication: No difficulties   Cognition Arousal/Alertness: Awake/alert Behavior During Therapy: WFL for tasks assessed/performed Overall Cognitive Status: Within Functional Limits for tasks assessed                     General Comments       Exercises       Shoulder Instructions      Home Living Family/patient expects to be discharged to:: Private residence Living Arrangements: Spouse/significant other Available Help at Discharge: Family;Available PRN/intermittently (son and girlfriend as much as they can provide) Type of Home: House Home Access: Stairs to enter Entrance Stairs-Number of Steps: several Entrance Stairs-Rails:  Right;Left Home Layout: One level     Bathroom Shower/Tub: Chief Strategy Officer: Standard     Home Equipment: None (may can borrow 3 in 1)          Prior Functioning/Environment Level of Independence: Independent        Comments: works in Horticulturist, 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    OT Diagnosis: Acute pain    OT Problem List: Decreased strength;Decreased range of motion;Decreased activity tolerance;Decreased knowledge of use of DME or AE;Decreased knowledge of precautions;Pain   OT Treatment/Interventions: Self-care/ADL training;DME and/or AE instruction;Therapeutic activities;Patient/family education;Balance training    OT Goals(Current goals can be found in the care plan section) Acute Rehab OT Goals Patient Stated Goal: not stated OT Goal Formulation: With patient Time For Goal Achievement: 12/09/13 Potential to Achieve Goals: Good ADL Goals Pt Will Perform Lower Body Bathing: with modified independence;with adaptive equipment;sit to/from stand;with caregiver independent in assisting Pt Will Perform Lower Body Dressing: with modified independence;with adaptive equipment;sit to/from stand;with caregiver independent in assisting Pt Will Transfer to Toilet: with modified independence;ambulating (3 in 1 over toilet) Pt Will Perform Toileting - Clothing Manipulation and hygiene: with modified independence;sit to/from stand Pt Will Perform Tub/Shower Transfer: Tub transfer;with supervision;ambulating;rolling walker (tub equipment tbd) Additional ADL Goal #1: Pt/caregiver will be independent in donning/doffing back brace.  OT Frequency: Min 2X/week   Barriers to D/C:            Co-evaluation              End of Session Equipment Utilized During Treatment: Gait belt;Rolling walker;Back brace  Activity Tolerance: Patient tolerated treatment well Patient left: in bed;with call bell/phone within reach;with family/visitor present   Time: 1142-1203 OT Time Calculation (min): 21 min Charges:  OT General Charges $OT Visit: 1 Procedure OT Evaluation $Initial OT Evaluation Tier I: 1 Procedure OT Treatments $Self Care/Home Management : 8-22 mins G-CodesEarlie Collins OTR/L [phone] 12/02/2013, 12:19 PM

## 2013-12-03 NOTE — Plan of Care (Signed)
Problem: Consults Goal: Diagnosis - Spinal Surgery Outcome: Completed/Met Date Met:  12/03/13 Thoraco/Lumbar Spine Fusion     

## 2013-12-03 NOTE — Progress Notes (Signed)
Patient ID: ROHIL LESCH, male   DOB: [birth year], 53 y.o.   MRN: [id number] Subjective:  The patient is alert and pleasant. He has been ambulating. He has urinary retention. He is beginning to have some bladder sensation. Objective: Vital signs in last 24 hours: Temp:  [98.1 F (36.7 C)-98.9 F (37.2 C)] 98.3 F (36.8 C) (09/16 1439) Pulse Rate:  [80-93] 88 (09/16 1439) Resp:  [18] 18 (09/16 1439) BP: (106-136)/(72-83) 120/72 mmHg (09/16 1439) SpO2:  [97 %-98 %] 98 % (09/16 1439)  Intake/Output from previous day: 09/15 0701 - 09/16 0700 In: -  Out: 1950 [Urine:1950] Intake/Output this shift:    Physical exam the patient is alert and oriented. His strength is grossly normal his lower extremities. His dressing is clean and dry. His wound is healing well.  Lab Results: No results found for this basename: WBC, HGB, HCT, PLT,  in the last 72 hours BMET No results found for this basename: NA, K, CL, CO2, GLUCOSE, BUN, CREATININE, CALCIUM,  in the last 72 hours  Studies/Results: No results found.  Assessment/Plan: Postop day #4: We will continue to mobilize the patient. We will likely need to reinsert a Foley catheter. We discussed rehabilitation. The patient prefers to go home and get home therapy.  LOS: 6 days     Marko Skalski D 12/03/2013, 5:32 PM

## 2013-12-03 NOTE — Progress Notes (Signed)
Occupational Therapy Treatment Patient Details Name: Billy Collins MRN: [id number] DOB: 1960-10-04 Today's Date: 12/03/2013    History of present illness Pt admitted after falling from a deer stand sustaining L1 and L3 burst fractures.  S/P: L3 laminectomy /foraminotomies to decompress the bilateral L3 and L4 nerve roots; open reduction of L3 burst fracture; posterior lateral arthrodesis at L1-2, L2-3, L3-4 and L4-5 with local morselized autograft bone and morphogenic protein, Bone graft extender; posterior segmental instrumentation with globus titanium pedicle screws and rods from L1-L5.   OT comments  Education provided. Plan to practice tub transfer next session. Pt states he plans to have family assist with ADLs.  Follow Up Recommendations  No OT follow up;Supervision - Intermittent    Equipment Recommendations  3 in 1 bedside comode    Recommendations for Other Services      Precautions / Restrictions Precautions Precautions: Back Precaution Comments: Reviewed precautions Required Braces or Orthoses: Spinal Brace Spinal Brace: Thoracolumbosacral orthotic;Applied in sitting position Restrictions Weight Bearing Restrictions: No       Mobility Bed Mobility Overal bed mobility: Needs Assistance Bed Mobility: Rolling;Sidelying to Sit;Sit to Sidelying Rolling: Min guard;Supervision Sidelying to sit: Min guard     Sit to sidelying: Min guard General bed mobility comments: cues for technique.  Transfers Overall transfer level: Needs assistance Equipment used: Rolling walker (2 wheeled) Transfers: Sit to/from Stand Sit to Stand: Min guard         General transfer comment: Min guard for safety.    Balance                                   ADL Overall ADL's : Needs assistance/impaired     Grooming: Oral care;Wash/dry face;Min guard;Standing           Upper Body Dressing : Sitting;Moderate assistance (back brace)       Toilet Transfer: Min  guard;Ambulation;RW (bed)           Functional mobility during ADLs: Min guard;Minimal assistance;Rolling walker General ADL Comments: Reviewed information on back brace, tips for self care to help maintain precautions. Educated on tub transfer techniques and what pt could use for shower chair. Educated on safety. Explained how pt could use long sponge for bathing.      Vision                     Perception     Praxis      Cognition   Behavior During Therapy: WFL for tasks assessed/performed Overall Cognitive Status: Within Functional Limits for tasks assessed                       Extremity/Trunk Assessment               Exercises     Shoulder Instructions       General Comments      Pertinent Vitals/ Pain       Pain Assessment: 0-10 Pain Score: 3  Pain Location: back Pain Descriptors / Indicators: Aching Pain Intervention(s): Monitored during session;Repositioned  Home Living                                          Prior Functioning/Environment  Frequency Min 2X/week     Progress Toward Goals  OT Goals(current goals can now be found in the care plan section)  Progress towards OT goals: Progressing toward goals  Acute Rehab OT Goals Patient Stated Goal: not stated OT Goal Formulation: With patient Time For Goal Achievement: 12/09/13 Potential to Achieve Goals: Good ADL Goals Pt Will Perform Lower Body Bathing: with modified independence;with adaptive equipment;sit to/from stand;with caregiver independent in assisting Pt Will Perform Lower Body Dressing: with modified independence;with adaptive equipment;sit to/from stand;with caregiver independent in assisting Pt Will Transfer to Toilet: with modified independence;ambulating (3 in 1 over toilet) Pt Will Perform Toileting - Clothing Manipulation and hygiene: with modified independence;sit to/from stand Pt Will Perform Tub/Shower Transfer: Tub  transfer;with supervision;ambulating;rolling walker (tub equipment tbd) Additional ADL Goal #1: Pt/caregiver will be independent in donning/doffing back brace.  Plan Discharge plan remains appropriate    Co-evaluation                 End of Session Equipment Utilized During Treatment: Gait belt;Rolling walker;Back brace   Activity Tolerance Patient tolerated treatment well   Patient Left in bed;with call bell/phone within reach;with bed alarm set;with family/visitor present   Nurse Communication          Time: 4098-1191 OT Time Calculation (min): 30 min  Charges: OT General Charges $OT Visit: 1 Procedure OT Treatments $Self Care/Home Management : 8-22 mins $Therapeutic Activity: 8-22 mins  Earlie Raveling OTR/L [phone] 12/03/2013, 1:38 PM

## 2013-12-03 NOTE — Progress Notes (Signed)
Physical Therapy Treatment Patient Details Name: Billy Collins MRN: [id number] DOB: 1960/04/25 Today's Date: 12/03/2013    History of Present Illness Pt admitted after falling from a deer stand sustaining L1 and L3 burst fractures.  S/P: L3 laminectomy /foraminotomies to decompress the bilateral L3 and L4 nerve roots; open reduction of L3 burst fracture; posterior lateral arthrodesis at L1-2, L2-3, L3-4 and L4-5 with local morselized autograft bone and morphogenic protein, Bone graft extender; posterior segmental instrumentation with globus titanium pedicle screws and rods from L1-L5.    PT Comments    Progressing steadily.  Still with a notable amount of gait instability.  Follow Up Recommendations  No PT follow up;Supervision for mobility/OOB     Equipment Recommendations  Rolling walker with 5" wheels;3in1 (PT)    Recommendations for Other Services       Precautions / Restrictions Precautions Precautions: Back Precaution Comments: Reviewed precautions Required Braces or Orthoses: Spinal Brace Spinal Brace: Thoracolumbosacral orthotic;Applied in sitting position Restrictions Weight Bearing Restrictions: No    Mobility  Bed Mobility Overal bed mobility: Needs Assistance Bed Mobility: Rolling;Sidelying to Sit;Sit to Sidelying Rolling: Supervision Sidelying to sit: Supervision     Sit to sidelying: Min guard General bed mobility comments: cues for technique.  Transfers Overall transfer level: Needs assistance Equipment used: Rolling walker (2 wheeled) Transfers: Sit to/from Stand Sit to Stand: Min guard         General transfer comment: Has trouble getting up from low surfaces. guard for safety  Ambulation/Gait Ambulation/Gait assistance: Min guard Ambulation Distance (Feet): 150 Feet Assistive device: Rolling walker (2 wheeled) Gait Pattern/deviations: Step-through pattern Gait velocity: slow and guarded   General Gait Details: mild knee buckling through  out gait cycle with heavy use of RW.   Stairs            Wheelchair Mobility    Modified Rankin (Stroke Patients Only)       Balance Overall balance assessment: No apparent balance deficits (not formally assessed)   Sitting balance-Leahy Scale: Fair Sitting balance - Comments: pain limiting unassisted sitting, pt having difficulty buckling the brace.     Standing balance-Leahy Scale: Fair                      Cognition Arousal/Alertness: Awake/alert Behavior During Therapy: WFL for tasks assessed/performed Overall Cognitive Status: Within Functional Limits for tasks assessed                      Exercises      General Comments General comments (skin integrity, edema, etc.): Reinforced all back education      Pertinent Vitals/Pain Pain Assessment: 0-10 Pain Score: 3  Pain Location: back Pain Descriptors / Indicators: Aching Pain Intervention(s): Monitored during session;Repositioned    Home Living                      Prior Function            PT Goals (current goals can now be found in the care plan section) Acute Rehab PT Goals Patient Stated Goal: not stated PT Goal Formulation: With patient Time For Goal Achievement: 12/09/13 Potential to Achieve Goals: Good Progress towards PT goals: Progressing toward goals    Frequency  Min 5X/week    PT Plan      Co-evaluation             End of Session Equipment Utilized During Treatment: Back brace Activity Tolerance:  Patient tolerated treatment well;Patient limited by pain Patient left: in chair;with call bell/phone within reach;with nursing/sitter in room     Time: 1410-1433 PT Time Calculation (min): 23 min  Charges:  $Gait Training: 8-22 mins $Therapeutic Activity: 8-22 mins                    G Codes:      Hussien Greenblatt, Eliseo Gum 12/03/2013, 2:42 PM 12/03/2013  [place] Bing, PT [phone]  (pager)

## 2013-12-04 NOTE — Progress Notes (Signed)
Physical Therapy Treatment Patient Details Name: MADEX SEALS MRN: [id number] DOB: 1960/11/23 Today's Date: 12/04/2013    History of Present Illness Pt admitted after falling from a deer stand sustaining L1 and L3 burst fractures.  S/P: L3 laminectomy /foraminotomies to decompress the bilateral L3 and L4 nerve roots; open reduction of L3 burst fracture; posterior lateral arthrodesis at L1-2, L2-3, L3-4 and L4-5 with local morselized autograft bone and morphogenic protein, Bone graft extender; posterior segmental instrumentation with globus titanium pedicle screws and rods from L1-L5.    PT Comments    Progressing well, but still needs assist and supervision  Follow Up Recommendations  No PT follow up;Supervision for mobility/OOB     Equipment Recommendations  Rolling walker with 5" wheels;3in1 (PT)    Recommendations for Other Services       Precautions / Restrictions Precautions Precautions: Back Precaution Comments: Reviewed precautions, pt. able to recall 3/3 and demonstrate compliance with during session Required Braces or Orthoses: Spinal Brace Spinal Brace: Thoracolumbosacral orthotic;Applied in sitting position    Mobility  Bed Mobility Overal bed mobility: Needs Assistance Bed Mobility: Rolling;Sidelying to Sit Rolling: Supervision Sidelying to sit: Supervision       General bed mobility comments: cues for technique, letting the rail go.  Transfers Overall transfer level: Needs assistance Equipment used: Rolling walker (2 wheeled) Transfers: Sit to/from Stand Sit to Stand: Min guard         General transfer comment: cues for best technique, trouble getting up from lower surfaces  Ambulation/Gait Ambulation/Gait assistance: Supervision Ambulation Distance (Feet): 150 Feet Assistive device: Rolling walker (2 wheeled) Gait Pattern/deviations: Step-through pattern Gait velocity: , but can pick up speed.   General Gait Details: mild knee buckline x1, mild  instability with extra lateral sway.   Stairs Stairs: Yes Stairs assistance: Min guard Stair Management: One rail Right;Step to pattern;Sideways Number of Stairs: 6 General stair comments: must use rail for lift assist  Wheelchair Mobility    Modified Rankin (Stroke Patients Only)       Balance Overall balance assessment: Needs assistance           Standing balance-Leahy Scale: Fair                      Cognition Arousal/Alertness: Awake/alert Behavior During Therapy: WFL for tasks assessed/performed Overall Cognitive Status: Within Functional Limits for tasks assessed                      Exercises      General Comments General comments (skin integrity, edema, etc.): Reinforced education      Pertinent Vitals/Pain      Home Living                      Prior Function            PT Goals (current goals can now be found in the care plan section) Acute Rehab PT Goals PT Goal Formulation: With patient Time For Goal Achievement: 12/09/13 Potential to Achieve Goals: Good Progress towards PT goals: Progressing toward goals    Frequency  Min 5X/week    PT Plan Current plan remains appropriate    Co-evaluation             End of Session Equipment Utilized During Treatment: Back brace Activity Tolerance: Patient tolerated treatment well;Patient limited by pain Patient left: in chair;with call bell/phone within reach;with nursing/sitter in room     Time: 1728-1750  PT Time Calculation (min): 22 min  Charges:  $Gait Training: 8-22 mins                    G Codes:      Jacarie Pate, Eliseo Gum 12/04/2013, 5:59 PM 12/04/2013  Aleneva Bing, PT [phone]  (pager)

## 2013-12-04 NOTE — Progress Notes (Signed)
Patient ID: Billy Collins male   DOB: Feb 23, 1961, 53 y.o.   MRN: [id number] Subjective:  The patient is alert and pleasant. He is in no apparent distress. He wants to go home.  Objective: Vital signs in last 24 hours: Temp:  [98.7 F (37.1 C)-99.7 F (37.6 C)] 99.7 F (37.6 C) (09/17 1412) Pulse Rate:  [89-102] 94 (09/17 1412) Resp:  [18-20] 20 (09/17 1412) BP: (119-135)/(73-91) 129/91 mmHg (09/17 1412) SpO2:  [95 %-99 %] 99 % (09/17 1412)  Intake/Output from previous day: 09/16 0701 - 09/17 0700 In: -  Out: 1400 [Urine:1400] Intake/Output this shift: Total I/O In: 600 [P.O.:600] Out: -   Physical exam the patient is alert and oriented x3. His dressing is clean and dry. His strength is grossly normal his bile gastrocnemius and dorsiflexors.  Lab Results: No results found for this basename: WBC, HGB, HCT, PLT,  in the last 72 hours BMET No results found for this basename: NA, K, CL, CO2, GLUCOSE, BUN, CREATININE, CALCIUM,  in the last 72 hours  Studies/Results: No results found.  Assessment/Plan: Postop day #5: The patient is progressing well.  Urinary retention: The patient will likely need to go home with a Foley catheter in place and followup with the urologist as an outpatient.  We will tentatively plan to send him home tomorrow.  LOS: 7 days     Delanda Bulluck D 12/04/2013, 3:36 PM

## 2013-12-04 NOTE — Progress Notes (Signed)
Occupational Therapy Treatment Patient Details Name: Billy Collins MRN: [id number] DOB: Aug 17, 1960 Today's Date: 12/04/2013    History of present illness Pt admitted after falling from a deer stand sustaining L1 and L3 burst fractures.  S/P: L3 laminectomy /foraminotomies to decompress the bilateral L3 and L4 nerve roots; open reduction of L3 burst fracture; posterior lateral arthrodesis at L1-2, L2-3, L3-4 and L4-5 with local morselized autograft bone and morphogenic protein, Bone graft extender; posterior segmental instrumentation with globus titanium pedicle screws and rods from L1-L5.   OT comments  Pt. Progressing well with OT.  Able to complete toilet transfer and tub transfer while adhering to back precautions.  Still requires cues for walker management during ambulation.  Declines A/E for LB ADLS states girlfriend will be available to assist.    Follow Up Recommendations  No OT follow up;Supervision - Intermittent    Equipment Recommendations  3 in 1 bedside comode          Precautions / Restrictions Precautions Precautions: Back Precaution Comments: Reviewed precautions, pt. able to recall 3/3 and demonstrate compliance with during session Required Braces or Orthoses: Spinal Brace Spinal Brace: Thoracolumbosacral orthotic;Applied in sitting position Restrictions Weight Bearing Restrictions: No       Mobility Bed Mobility Overal bed mobility: Needs Assistance Bed Mobility: Rolling;Sidelying to Sit Rolling: Supervision Sidelying to sit: Min guard       General bed mobility comments: cues for technique, HOB flat no rails to simulate home environment  Transfers Overall transfer level: Needs assistance Equipment used: Rolling walker (2 wheeled) Transfers: Sit to/from Stand Sit to Stand: Min guard         General transfer comment: Has trouble getting up from low surfaces. guard for safety                                       ADL Overall ADL's :  Needs assistance/impaired               Lower Body Bathing Details (indicate cue type and reason): declines practice and use of A/E stating financial reasons and will have girlfriend available to assist Upper Body Dressing : Minimal assistance;Sitting;Cueing for sequencing Upper Body Dressing Details (indicate cue type and reason): don brace in sitting, eob   Lower Body Dressing Details (indicate cue type and reason): declines practive and use of A/E stating financial reasons and will have girlfriend to assist Toilet Transfer: Min guard;Cueing for safety;Regular Toilet;Grab bars;RW   Toileting- Architect and Hygiene: Min guard;Sit to/from stand Toileting - Clothing Manipulation Details (indicate cue type and reason): simulated during toilet transfer practice Tub/ Shower Transfer: Tub transfer;Minimal assistance;Adhering to back precautions;Cueing for sequencing;Ambulation Tub/Shower Transfer Details (indicate cue type and reason): reviewed use of 3n1 if tub width adequate Functional mobility during ADLs: Min guard;Rolling walker General ADL Comments: able to don brace min a.  reviewed benefits of A/E pt. con't. to decline.  interested in 3n1.  will recommend.  min a for tub transfer instructed pt. on tech. for safety including where his girlfriend would need to be to stabalize the RW.  pt. verbalized and demonstrated safe technique  General Comments  works in Hotel manager, plans to return to work    Pertinent Vitals/ Pain       Pain Assessment: No/denies pain                                                          Frequency Min 2X/week     Progress Toward Goals  OT Goals(current goals can now be found in the care plan section)  Progress towards OT goals: Progressing toward goals     Plan  Discharge plan remains appropriate                     End of Session Equipment Utilized During Treatment: Gait belt;Rolling walker;Back brace   Activity Tolerance Patient tolerated treatment well   Patient Left in chair;with call bell/phone within reach;with family/visitor present             Time: 2130-8657 OT Time Calculation (min): 29 min  Charges: OT General Charges $OT Visit: 1 Procedure OT Treatments $Self Care/Home Management : 23-37 mins  Robet Leu, COTA/L 12/04/2013, 11:41 AM

## 2013-12-05 MED ORDER — DSS 100 MG PO CAPS: 100.0000 mg | ORAL_CAPSULE | Freq: Two times a day (BID) | ORAL | Status: DC

## 2013-12-05 MED ORDER — DIAZEPAM 5 MG PO TABS: 5.0000 mg | ORAL_TABLET | Freq: Four times a day (QID) | ORAL | Status: DC | PRN

## 2013-12-05 MED ORDER — OXYCODONE-ACETAMINOPHEN 10-325 MG PO TABS: 1.0000 | ORAL_TABLET | ORAL | Status: DC | PRN

## 2013-12-05 NOTE — Progress Notes (Signed)
UR complete.  Adlai Nieblas RN, MSN 

## 2013-12-05 NOTE — Progress Notes (Signed)
Physical Therapy Treatment Patient Details Name: Billy Collins MRN: [id number] DOB: 1960-07-19 Today's Date: 12/05/2013    History of Present Illness Pt admitted after falling from a deer stand sustaining L1 and L3 burst fractures.  S/P: L3 laminectomy /foraminotomies to decompress the bilateral L3 and L4 nerve roots; open reduction of L3 burst fracture; posterior lateral arthrodesis at L1-2, L2-3, L3-4 and L4-5 with local morselized autograft bone and morphogenic protein, Bone graft extender; posterior segmental instrumentation with globus titanium pedicle screws and rods from L1-L5.    PT Comments    Progressing well.  Will need more time to recovery independent function, but has necessary assist at home.   Follow Up Recommendations  Supervision for mobility/OOB;No PT follow up     Equipment Recommendations  None recommended by PT (per pt he has RW and wants to defer purchase of 3 in 1.)    Recommendations for Other Services       Precautions / Restrictions Precautions Precautions: Back Precaution Comments: Reviewed precautions, pt. able to recall 3/3 and demonstrate compliance with during session Required Braces or Orthoses: Spinal Brace Spinal Brace: Thoracolumbosacral orthotic;Applied in sitting position Restrictions Weight Bearing Restrictions: No    Mobility  Bed Mobility Overal bed mobility: Needs Assistance Bed Mobility: Rolling;Sidelying to Sit Rolling: Supervision Sidelying to sit: Supervision       General bed mobility comments: cues for technique  no rail needed  Transfers Overall transfer level: Needs assistance Equipment used: Rolling walker (2 wheeled) Transfers: Sit to/from Stand Sit to Stand: Min guard         General transfer comment: cues for best technique, trouble getting up from lower surfaces  Ambulation/Gait Ambulation/Gait assistance: Supervision Ambulation Distance (Feet): 150 Feet Assistive device: Rolling walker (2 wheeled) Gait  Pattern/deviations: Step-through pattern Gait velocity: , but can pick up speed.   General Gait Details: more steady daily, but with mild knee instability at best   Stairs            Wheelchair Mobility    Modified Rankin (Stroke Patients Only)       Balance Overall balance assessment: Needs assistance Sitting-balance support: Feet supported;No upper extremity supported Sitting balance-Leahy Scale: Good     Standing balance support: No upper extremity supported Standing balance-Leahy Scale: Fair                      Cognition Arousal/Alertness: Awake/alert Behavior During Therapy: WFL for tasks assessed/performed Overall Cognitive Status: Within Functional Limits for tasks assessed                      Exercises      General Comments General comments (skin integrity, edema, etc.): Reinforced back care/prec, bracing issues, lifting restrictions, progression of activity and bed mobility with pt and his son.      Pertinent Vitals/Pain Pain Assessment: No/denies pain Pain Score: 0-No pain    Home Living                      Prior Function            PT Goals (current goals can now be found in the care plan section) Acute Rehab PT Goals Time For Goal Achievement: 12/09/13 Potential to Achieve Goals: Good Progress towards PT goals: Progressing toward goals    Frequency  Min 5X/week    PT Plan Current plan remains appropriate    Co-evaluation  End of Session Equipment Utilized During Treatment: Back brace Activity Tolerance: Patient tolerated treatment well;Patient limited by pain Patient left: in chair;with call bell/phone within reach;with nursing/sitter in room     Time: 1039-1106 PT Time Calculation (min): 27 min  Charges:  $Gait Training: 8-22 mins $Self Care/Home Management: 8-22                    G Codes:      Billy Collins, Billy Collins 12/05/2013, 11:14 AM 12/05/2013  Billy Collins,  PT [phone]  (pager)

## 2013-12-05 NOTE — Discharge Summary (Signed)
Physician Discharge Summary  Patient ID: Billy Collins MRN: [id number] DOB/AGE: 52-Jun-1962 52 y.o.  Admit date: 11/27/2013 Discharge date: 12/05/2013  Admission Diagnoses: L1 fracture, L3 burst fracture, lumbago, urinary retention  Discharge Diagnoses: The same Active Problems:   Lumbar burst fracture   Discharged Condition: good  Hospital Course: I performed a L1-L5 decompression instrumentation and fusion on the patient on 11/29/2013.  The patient's postoperative course was remarkable only for urinary retention. We had to reinsert the Foley catheter. The patient progressed with PT and OT. We discussed inpatient rehabilitation but the patient declined. He has requested discharge to home. Patient was given oral and written discharge instructions. All his questions were answered. I will plan to see him back in a week to remove his sutures. We will have her follow up with a neurologist regarding his urinary retention. He is to be discharged with a Foley catheter and leg bag. He has been instructed in catheter care.  Consults: PT, OT Significant Diagnostic Studies: CT scans Treatments: L1-L5 decompression, instrumentation, fusion Discharge Exam: Blood pressure 140/87, pulse 94, temperature 98.6 F (37 C), temperature source Oral, resp. rate 18, height  (1.753 m), weight 85.73 kg (189 lb), SpO2 98.00%. Patient is alert and pleasant. His strength is grossly normal his lower extremities. He has some perineal numbness  Disposition: Home  Discharge Instructions   Call MD for:  difficulty breathing, headache or visual disturbances    Complete by:  As directed      Call MD for:  extreme fatigue    Complete by:  As directed      Call MD for:  hives    Complete by:  As directed      Call MD for:  persistant dizziness or light-headedness    Complete by:  As directed      Call MD for:  persistant nausea and vomiting    Complete by:  As directed      Call MD for:  redness, tenderness,  or signs of infection (pain, swelling, redness, odor or green/yellow discharge around incision site)    Complete by:  As directed      Call MD for:  severe uncontrolled pain    Complete by:  As directed      Call MD for:  temperature >100.4    Complete by:  As directed      Diet - low sodium heart healthy    Complete by:  As directed      Discharge instructions    Complete by:  As directed   Call [phone] for a followup appointment. Take a stool softener while you are using pain medications.     Driving Restrictions    Complete by:  As directed   Do not drive for 2 weeks.     Increase activity slowly    Complete by:  As directed      Lifting restrictions    Complete by:  As directed   Do not lift more than 5 pounds. No excessive bending or twisting.     May shower / Bathe    Complete by:  As directed   He may shower after the pain she is removed 3 days after surgery. Leave the incision alone.     No dressing needed    Complete by:  As directed             Medication List         diazepam 5 MG tablet  Commonly  known as:  VALIUM  Take 1 tablet (5 mg total) by mouth every 6 (six) hours as needed for muscle spasms.     DSS 100 MG Caps  Take 100 mg by mouth 2 (two) times daily.     oxyCODONE-acetaminophen 10-325 MG per tablet  Commonly known as:  PERCOCET  Take 1 tablet by mouth every 4 (four) hours as needed for pain.         SignedTressie Stalker D 12/05/2013, 8:01 AM

## 2013-12-09 ENCOUNTER — Encounter (HOSPITAL_COMMUNITY): Payer: Self-pay | Admitting: Neurosurgery

## 2013-12-10 ENCOUNTER — Encounter (HOSPITAL_COMMUNITY): Payer: Self-pay | Admitting: Emergency Medicine

## 2013-12-10 ENCOUNTER — Emergency Department (HOSPITAL_COMMUNITY)
Admission: EM | Admit: 2013-12-10 | Discharge: 2013-12-10 | Disposition: A | Discharge status: Home / Self Care | Payer: BC Managed Care – PPO | Attending: Emergency Medicine | Admitting: Emergency Medicine

## 2013-12-10 DIAGNOSIS — N390 Urinary tract infection, site not specified: Secondary | ICD-10-CM | POA: Diagnosis not present

## 2013-12-10 DIAGNOSIS — I509 Heart failure, unspecified: Secondary | ICD-10-CM | POA: Diagnosis not present

## 2013-12-10 DIAGNOSIS — Z79899 Other long term (current) drug therapy: Secondary | ICD-10-CM | POA: Insufficient documentation

## 2013-12-10 DIAGNOSIS — Y846 Urinary catheterization as the cause of abnormal reaction of the patient, or of later complication, without mention of misadventure at the time of the procedure: Secondary | ICD-10-CM | POA: Diagnosis not present

## 2013-12-10 DIAGNOSIS — R339 Retention of urine, unspecified: Secondary | ICD-10-CM | POA: Insufficient documentation

## 2013-12-10 DIAGNOSIS — J45909 Unspecified asthma, uncomplicated: Secondary | ICD-10-CM | POA: Insufficient documentation

## 2013-12-10 DIAGNOSIS — K5909 Other constipation: Secondary | ICD-10-CM

## 2013-12-10 DIAGNOSIS — Z87891 Personal history of nicotine dependence: Secondary | ICD-10-CM | POA: Insufficient documentation

## 2013-12-10 DIAGNOSIS — Z9889 Other specified postprocedural states: Secondary | ICD-10-CM | POA: Diagnosis not present

## 2013-12-10 DIAGNOSIS — E119 Type 2 diabetes mellitus without complications: Secondary | ICD-10-CM | POA: Diagnosis not present

## 2013-12-10 DIAGNOSIS — I10 Essential (primary) hypertension: Secondary | ICD-10-CM | POA: Insufficient documentation

## 2013-12-10 DIAGNOSIS — T83091A Other mechanical complication of indwelling urethral catheter, initial encounter: Secondary | ICD-10-CM | POA: Diagnosis not present

## 2013-12-10 DIAGNOSIS — T83511A Infection and inflammatory reaction due to indwelling urethral catheter, initial encounter: Secondary | ICD-10-CM

## 2013-12-10 DIAGNOSIS — K5900 Constipation, unspecified: Secondary | ICD-10-CM | POA: Insufficient documentation

## 2013-12-10 LAB — URINALYSIS, ROUTINE W REFLEX MICROSCOPIC
Bilirubin Urine: NEGATIVE
GLUCOSE, UA: NEGATIVE mg/dL
Ketones, ur: NEGATIVE mg/dL
Nitrite: POSITIVE — AB
Protein, ur: >300 mg/dL — AB
SPECIFIC GRAVITY, URINE: 1.025 (ref 1.005–1.030)
UROBILINOGEN UA: 1 mg/dL (ref 0.0–1.0)
pH: 6 (ref 5.0–8.0)

## 2013-12-10 LAB — URINE MICROSCOPIC-ADD ON

## 2013-12-10 MED ORDER — FLEET ENEMA 7-19 GM/118ML RE ENEM
1.0000 | ENEMA | Freq: Once | RECTAL | Status: AC
  Administered 2013-12-10: 14:00:00 via RECTAL
  Filled 2013-12-10: qty 1

## 2013-12-10 MED ORDER — CEPHALEXIN 500 MG PO CAPS: 500.0000 mg | ORAL_CAPSULE | Freq: Four times a day (QID) | ORAL | Status: DC

## 2013-12-10 MED ORDER — OXYCODONE-ACETAMINOPHEN 5-325 MG PO TABS
2.0000 | ORAL_TABLET | Freq: Once | ORAL | Status: AC
  Administered 2013-12-10: 2 via ORAL
  Filled 2013-12-10: qty 2

## 2013-12-10 MED ORDER — CEPHALEXIN 250 MG PO CAPS
1000.0000 mg | ORAL_CAPSULE | Freq: Once | ORAL | Status: AC
  Administered 2013-12-10: 1000 mg via ORAL
  Filled 2013-12-10: qty 4

## 2013-12-10 MED ORDER — POLYETHYLENE GLYCOL 3350 17 G PO PACK: 17.0000 g | PACK | Freq: Every day | ORAL | Status: DC

## 2013-12-10 NOTE — ED Notes (Signed)
Bladder scn completed and read greater than .

## 2013-12-10 NOTE — ED Provider Notes (Signed)
Medical screening examination/treatment/procedure(s) were performed by non-physician practitioner and as supervising physician I was immediately available for consultation/collaboration.   Arnetha Silverthorne L Brooks Stotz, MD 12/10/13 2038 

## 2013-12-10 NOTE — ED Notes (Signed)
Pt states that he recently fell and had back surgery and was discharged on Friday the 18th. Pt states that he has not had any output from his catheter since 930pm last night. Pt states that he feels that his bladder is full and has pain in his lower abdomen. Pt states that he also has not had a bowel movement far 6 days.

## 2013-12-10 NOTE — ED Provider Notes (Signed)
CSN: [id number]     Arrival date & time 12/10/13  1018 History   First MD Initiated Contact with Patient 12/10/13 1303     Chief Complaint  Patient presents with  . Urinary Retention     (Consider location/radiation/quality/duration/timing/severity/associated sxs/prior Treatment) HPI Comments: Patient is a 53 year old male with a past medical history of CHF, diabetes, hypertension, renal insufficiency, multiple myeloma and asthma who presents to the emergency department complaining of decreased urine output from this catheter beginning around 9:30 PM yesterday. Pt had back surgery after a lumbar blow-out fracture on 11/27/13, discharged home on 12/05/13. He was discharged home with a foley catheter which he was not having any problems with until last night. He endorses a full feeling in his bladder causing mild pain rated 2/10. While in the waiting room, his girlfriend "smooshed" the catheter a little and was able to get some "gunk out" causing his urine to come out making him feel a little better. Denies fever or chills. States he has not had a bowel movement in over 6 days. States he had a small moderate liquid come out. Initially he was not able to "squeeze his butt muscles to hold an enema", however now states his strength has improved and he believes he'll be able to do so. Denies nausea or vomiting.  The history is provided by the patient and a significant other.    History reviewed. No pertinent past medical history. Past Surgical History  Procedure Laterality Date  . Posterior lumbar fusion 4 level N/A 11/29/2013    Procedure: LUMBAR LAMINECTOMY/DECOMPRESSION WITH INSTRUMENTATION FUSION L1-L5;  Surgeon: Newman Pies, MD;  Location: [facility];  Service: Neurosurgery;  Laterality: N/A;   No family history on file. History  Substance Use Topics  . Smoking status: Former Research scientist (life sciences)  . Smokeless tobacco: Not on file  . Alcohol Use: No    Review of Systems  Gastrointestinal: Positive for  constipation.  Genitourinary: Positive for difficulty urinating.  All other systems reviewed and are negative.     Allergies  Review of patient's allergies indicates no known allergies.  Home Medications   Prior to Admission medications   Medication Sig Start Date End Date Taking? Authorizing Provider  diazepam (VALIUM) 5 MG tablet Take 1 tablet (5 mg total) by mouth every 6 (six) hours as needed for muscle spasms. 12/05/13  Yes Newman Pies, MD  docusate sodium 100 MG CAPS Take 100 mg by mouth 2 (two) times daily. 12/05/13  Yes Newman Pies, MD  oxyCODONE-acetaminophen (PERCOCET) 10-325 MG per tablet Take 1 tablet by mouth every 4 (four) hours as needed for pain. 12/05/13  Yes Newman Pies, MD  cephALEXin (KEFLEX) 500 MG capsule Take 1 capsule (500 mg total) by mouth 4 (four) times daily. 12/10/13   Illene Labrador, PA-C  polyethylene glycol (MIRALAX / GLYCOLAX) packet Take 17 g by mouth daily. 12/10/13   Illene Labrador, PA-C   BP 119/74  Pulse 79  Temp(Src) 98 F (36.7 C) (Oral)  Resp 18  SpO2 99% Physical Exam  Nursing note and vitals reviewed. Constitutional: He is oriented to person, place, and time. He appears well-developed and well-nourished. No distress.  HENT:  Head: Normocephalic and atraumatic.  Mouth/Throat: Oropharynx is clear and moist.  Eyes: Conjunctivae are normal.  Neck: Normal range of motion. Neck supple.  Cardiovascular: Normal rate, regular rhythm and normal heart sounds.   Pulmonary/Chest: Effort normal and breath sounds normal.  Abdominal: Soft. Normal appearance and bowel sounds are normal. He  exhibits no distension. There is tenderness (mild) in the suprapubic area. There is no rigidity, no rebound and no guarding.  Genitourinary:  Foley catheter in place with urine with clumps in it in bag. No surrounding erythema, edema or tenderness at meatus.  Musculoskeletal: Normal range of motion. He exhibits no edema.  Neurological: He is alert and  oriented to person, place, and time.  Skin: Skin is warm and dry. He is not diaphoretic.  Psychiatric: He has a normal mood and affect. His behavior is normal.    ED Course  Procedures (including critical care time) Labs Review Labs Reviewed  URINALYSIS, ROUTINE W REFLEX MICROSCOPIC - Abnormal; Notable for the following:    Color, Urine AMBER (*)    APPearance TURBID (*)    Hgb urine dipstick LARGE (*)    Protein, ur >300 (*)    Nitrite POSITIVE (*)    Leukocytes, UA MODERATE (*)    All other components within normal limits  URINE MICROSCOPIC-ADD ON - Abnormal; Notable for the following:    Bacteria, UA MANY (*)    Casts HYALINE CASTS (*)    All other components within normal limits  URINE CULTURE    Imaging Review No results found.   EKG Interpretation None      MDM   Final diagnoses:  UTI (urinary tract infection) due to urinary indwelling Foley catheter  Other constipation   Patient presenting with urinary retention and constipation, recent spine surgery. He is nontoxic appearing and in no apparent distress. Afebrile, vital signs stable. Large clumps noted in catheter, once catheter adjusted, he was able to express urine. Foley catheter changed. Urine is positive for infection. First dose of antibiotics given in the emergency department. He also had a bowel movement after receiving an enema. Abdomen is soft and nontender after catheter replaced and he was able to urinate. Stable for discharge. Return precautions given. Patient states understanding of treatment care plan and is agreeable.  Illene Labrador, PA-C 12/10/13 1552

## 2013-12-10 NOTE — Discharge Instructions (Signed)
Take antibiotic to completion. Use MiraLax as directed for constipation. Followup with your primary care physician.  Catheter-Associated Urinary Tract Infection FAQs WHAT IS "CATHETER-ASSOCIATED" URINARY TRACT INFECTION? A urinary tract infection (also called "UTI") is an infection in the urinary system, which includes the bladder (which stores the urine) and the kidneys (which filter the blood to make urine). Germs (for example, bacteria or yeasts) do not normally live in these areas; but if germs are introduced, an infection can occur. If you have a urinary catheter, germs can travel along the catheter and cause an infection in your bladder or your kidney; in that case it is called a catheter-associated urinary tract infection (or "CA-UTI").  WHAT IS A URINARY CATHETER? A urinary catheter is a thin tube placed in the bladder to drain urine. Urine drains through the tube into a bag that collects the urine. A urinary  catheter may be used:  If you are not able to urinate on your own.  To measure the amount of urine that you make, for example, during intensive care.  During and after some types of surgery.  During some tests of the kidneys and bladder . People with urinary catheters have a much higher chance of getting a urinary tract infection than people who don't have a catheter. HOW DO I GET A CATHETER-ASSOCIATED URINARY TRACT INFECTION (CA-UTI)? If germs enter the urinary tract, they may cause an infection. Many of the germs that cause a catheter-associated urinary tract infection are common germs found in your intestines that do not usually cause an infection there. Germs can enter the urinary tract when the catheter is being put in or while the catheter remains in the bladder.  WHAT ARE THE SYMPTOMS OF A URINARY TRACT INFECTION?  Some of the common symptoms of a urinary tract infection are:  Burning or pain in the lower abdomen (that is, below the stomach).  Fever.  Bloody urine may  be a sign of infection, but is also caused by other problems .  Burning during urination or an increase in the frequency of urination after the catheter is removed. Sometimes people with catheter-associated urinary tract infections do not have these symptoms of infection. CAN CATHETER-ASSOCIATED URINARY TRACT INFECTIONS BE TREATED? Yes, most catheter-associated urinary tract infections can be treated with antibiotics and removal or change of the catheter. Your doctor will determine which antibiotic is best for you.  WHAT ARE SOME OF THE THINGS THAT HOSPITALS ARE DOING TO PREVENT CATHETER-ASSOCIATED URINARY TRACT INFECTIONS? To prevent urinary tract infections, doctors and nurses take the following actions.  Catheter insertion  Catheters are put in only when necessary and they are removed as soon as possible.  Only properly trained persons insert catheters using sterile ("clean") technique.  The skin in the area where the catheter will be inserted is cleaned before inserting the catheter.  Other methods to drain the urine are sometimes used, such as:  External catheters in men (these look like condoms and are placed over the penis rather than into the penis)  Putting a temporary catheter in to drain the urine and removing it right away. This is called intermittent urethral catheterization. Catheter care  Healthcare providers clean their hands by washing them with soap and water or using an alcohol-based hand rub before and after touching your catheter.  If you do not see your providers clean their hands, please ask them to do so.  Avoid disconnecting the catheter and drain tube. This helps to prevent germs from getting  into the catheter tube.  The catheter is secured to the leg to prevent pulling on the catheter.  Avoid twisting or kinking the catheter.  Keep the bag lower than the bladder to prevent urine from backflowing to the bladder.  Empty the bag regularly. The drainage spout  should not touch anything while emptying the bag. WHAT CAN I DO TO HELP PREVENT CATHETER-ASSOCIATED URINARY TRACT INFECTIONS IF I HAVE A CATHETER?  Always clean your hands before and after doing catheter care.  Always keep your urine bag below the level of your bladder.  Do not tug or pull on the tubing.  Do not twist or kink the catheter tubing.  Ask your healthcare provider each day if you still need the catheter. WHAT DO I NEED TO DO WHEN I GO HOME FROM THE HOSPITAL?  If you will be going home with a catheter, your doctor or nurse should explain everything you need to know about taking care of the catheter. Make sure you understand how to care for it before you leave the hospital.  If you develop any of the symptoms of a urinary tract infection, such as burning or pain in the lower abdomen, fever, or an increase in the frequency of urination, contact your doctor or nurse immediately.  Before you go home, make sure you know who to contact if you have questions or problems after you get home. If you have questions, please ask your doctor or nurse. Developed and co-sponsored by [employer] for [employer] of [place] [phone]); Infectious Diseases Society of America (IDSA); The [facility] Association; Association for Professionals in Infection Control and Epidemiology (APIC); Center for Disease Control (CDC); and The Joint Commission Document Released: 11/29/2011 Document Reviewed: 11/29/2011 [facility] Patient Information 2015 [place], [place]. This information is not intended to replace advice given to you by your health care provider. Make sure you discuss any questions you have with your health care provider.  Urinary Tract Infection Urinary tract infections (UTIs) can develop anywhere along your urinary tract. Your urinary tract is your body's drainage system for removing wastes and extra water. Your urinary tract includes two kidneys, two ureters, a bladder, and a urethra.  Your kidneys are a pair of bean-shaped organs. Each kidney is about the size of your fist. They are located below your ribs, one on each side of your spine. CAUSES Infections are caused by microbes, which are microscopic organisms, including fungi, viruses, and bacteria. These organisms are so small that they can only be seen through a microscope. Bacteria are the microbes that most commonly cause UTIs. SYMPTOMS  Symptoms of UTIs may vary by age and gender of the patient and by the location of the infection. Symptoms in young women typically include a frequent and intense urge to urinate and a painful, burning feeling in the bladder or urethra during urination. Older women and men are more likely to be tired, shaky, and weak and have muscle aches and abdominal pain. A fever may mean the infection is in your kidneys. Other symptoms of a kidney infection include pain in your back or sides below the ribs, nausea, and vomiting. DIAGNOSIS To diagnose a UTI, your caregiver will ask you about your symptoms. Your caregiver also will ask to provide a urine sample. The urine sample will be tested for bacteria and white blood cells. White blood cells are made by your body to help fight infection. TREATMENT  Typically, UTIs can be treated with medication. Because most UTIs are caused by  a bacterial infection, they usually can be treated with the use of antibiotics. The choice of antibiotic and length of treatment depend on your symptoms and the type of bacteria causing your infection. HOME CARE INSTRUCTIONS  If you were prescribed antibiotics, take them exactly as your caregiver instructs you. Finish the medication even if you feel better after you have only taken some of the medication.  Drink enough water and fluids to keep your urine clear or pale yellow.  Avoid caffeine, tea, and carbonated beverages. They tend to irritate your bladder.  Empty your bladder often. Avoid holding urine for long periods of  time.  Empty your bladder before and after sexual intercourse.  After a bowel movement, women should cleanse from front to back. Use each tissue only once. SEEK MEDICAL CARE IF:   You have back pain.  You develop a fever.  Your symptoms do not begin to resolve within 3 days. SEEK IMMEDIATE MEDICAL CARE IF:   You have severe back pain or lower abdominal pain.  You develop chills.  You have nausea or vomiting.  You have continued burning or discomfort with urination. MAKE SURE YOU:   Understand these instructions.  Will watch your condition.  Will get help right away if you are not doing well or get worse. Document Released: 12/14/2004 Document Revised: 09/05/2011 Document Reviewed: 04/14/2011 [facility] Patient Information 2015 [place], [place]. This information is not intended to replace advice given to you by your health care provider. Make sure you discuss any questions you have with your health care provider.

## 2013-12-12 LAB — URINE CULTURE

## 2013-12-14 ENCOUNTER — Telehealth (HOSPITAL_BASED_OUTPATIENT_CLINIC_OR_DEPARTMENT_OTHER): Payer: Self-pay

## 2013-12-14 NOTE — Telephone Encounter (Signed)
Post ED Visit - Positive Culture Follow-up  Culture report reviewed by antimicrobial stewardship pharmacist:  Wes Dulaney, Pharm.D., BCPS  Celedonio Miyamoto, [address].D., BCPS  Georgina Pillion, Pharm.D., BCPS  [place], [address].D., BCPS, AAHIVP  Estella Husk, Pharm.D., BCPS, AAHIVP  Carly Sabat, Pharm.D.  Enzo Bi, Pharm.D.  Positive Urine culture, >/= 100,000 colonies -> E Coli Treated with Cephalexin, organism sensitive to the same and no further patient follow-up is required at this time.  Arvid Right 12/14/2013, 1:03 AM

## 2014-12-26 IMAGING — CT CT ABD-PELV W/ CM
2 of 6 series · 15 of 46 positions shown, 17 images · IV contrast (APPLIED)
Comparison: Prior radiographs from earlier the same day

CLINICAL DATA: Trauma

EXAM:
CT CHEST, ABDOMEN, AND PELVIS WITH CONTRAST
TECHNIQUE: Multidetector CT imaging of the chest, abdomen and pelvis was
performed following the standard protocol during bolus
administration of intravenous contrast.
CONTRAST:  100mL OMNIPAQUE IOHEXOL 300 MG/ML  SOLN

[Series 5: coronal · coronal · 1.27mm/px · 3 of 92 slices shown]
[im 31/92  soft-tissue]
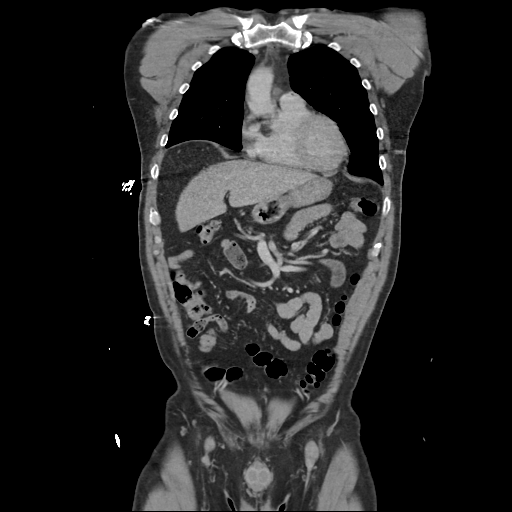
[im 41/92  soft-tissue]
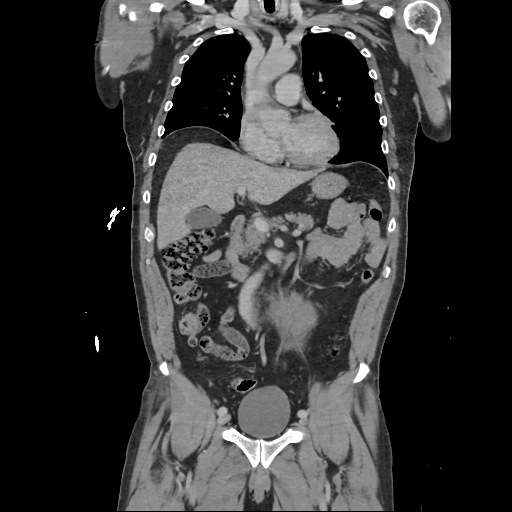
[im 51/92  soft-tissue]
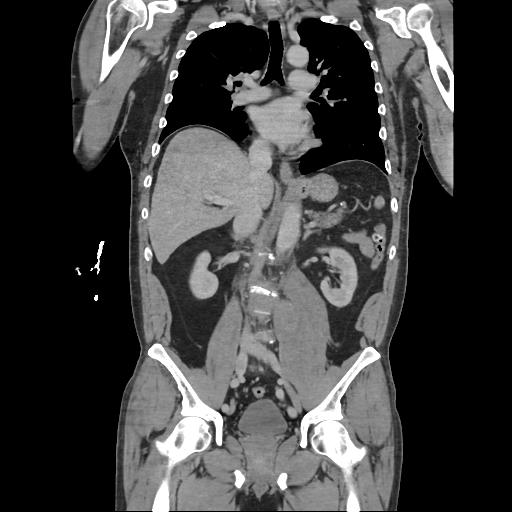

[Series 8: thins · axial · 0.82mm/px · z∈[+272,+869]mm · 12 of 815 slices shown, 14 images]
[im 34/815  soft-tissue]
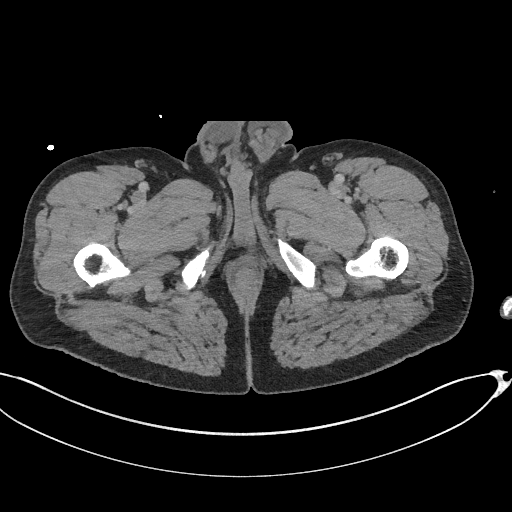
[im 34/815  bone]
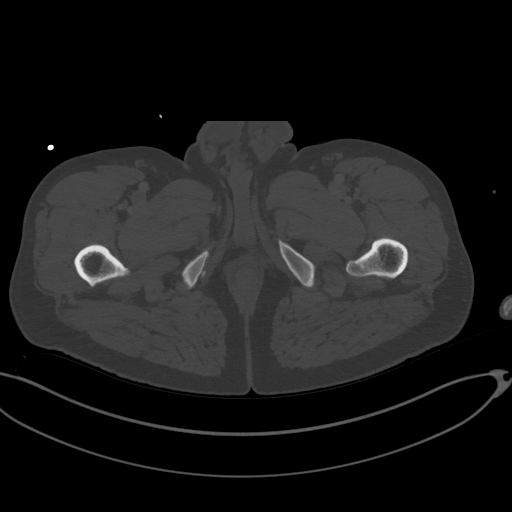
[im 102/815  soft-tissue]
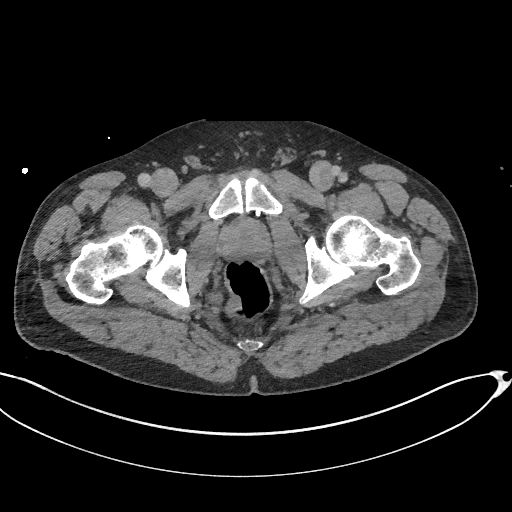
[im 170/815  soft-tissue]
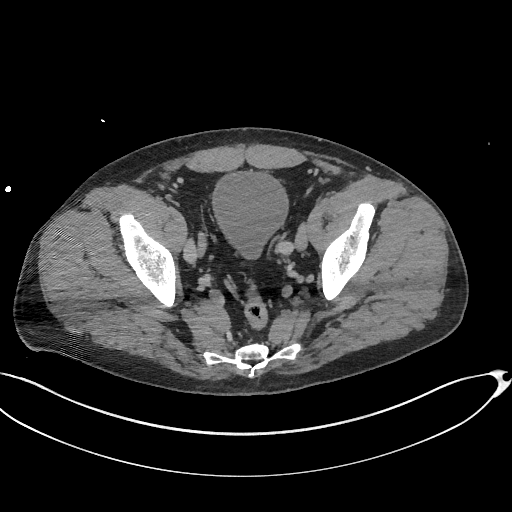
[im 238/815  soft-tissue]
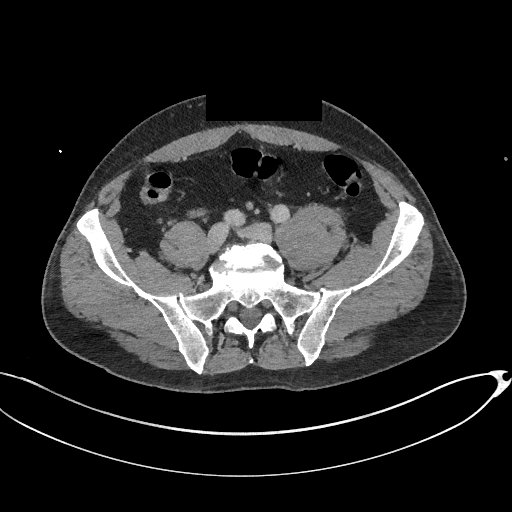
[im 306/815  soft-tissue]
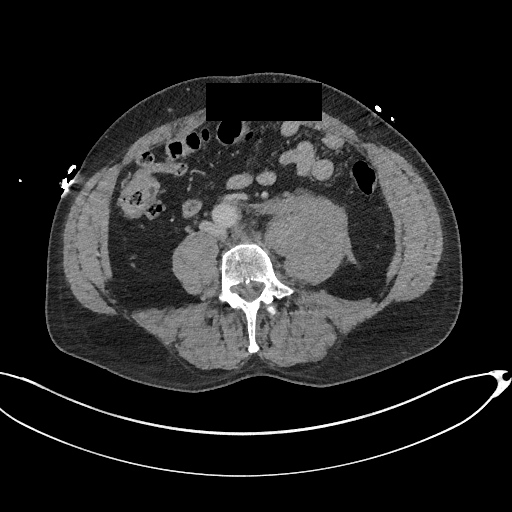
[im 374/815  soft-tissue]
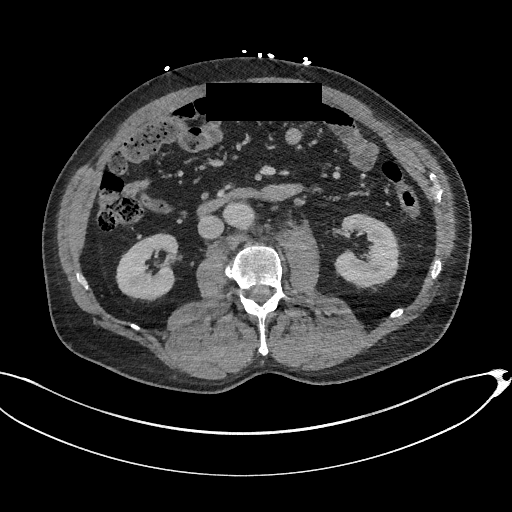
[im 441/815  soft-tissue]
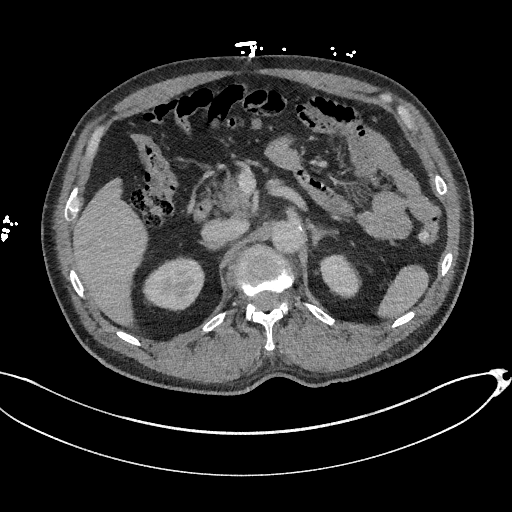
[im 509/815  soft-tissue]
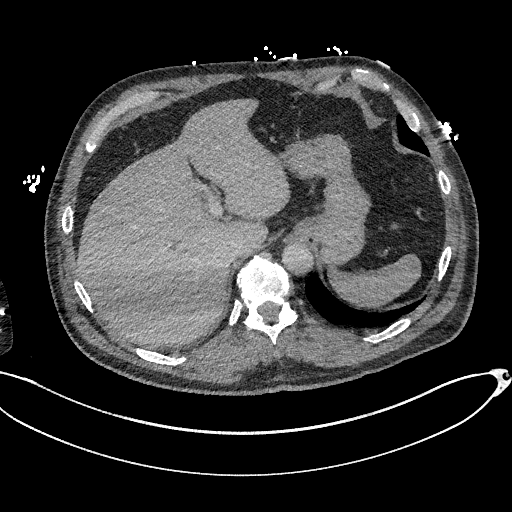
[im 577/815  soft-tissue]
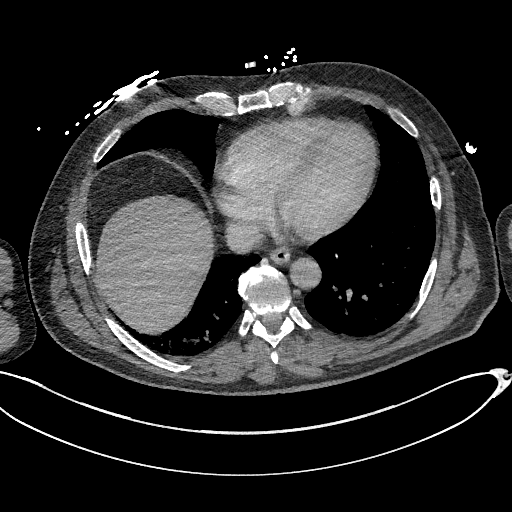
[im 577/815  bone]
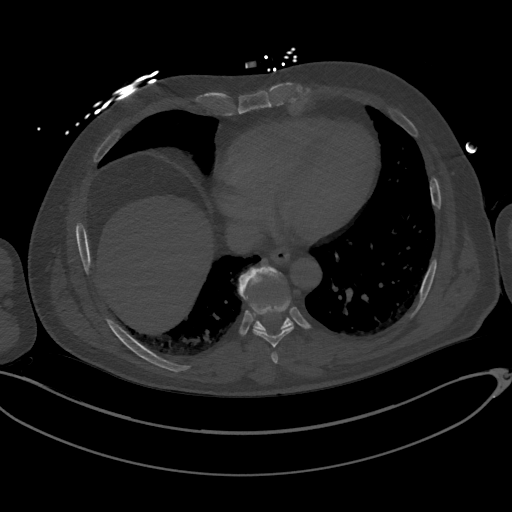
[im 645/815  soft-tissue]
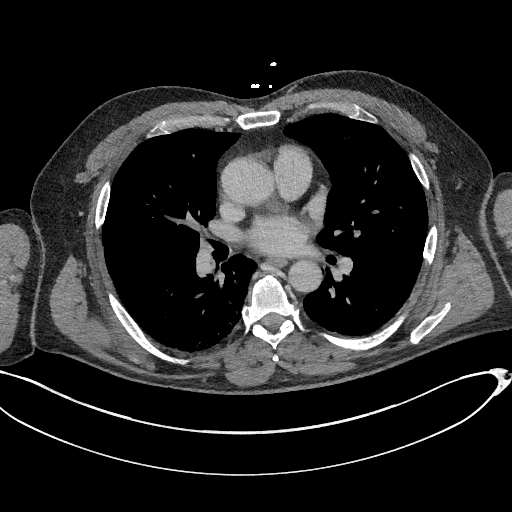
[im 713/815  soft-tissue]
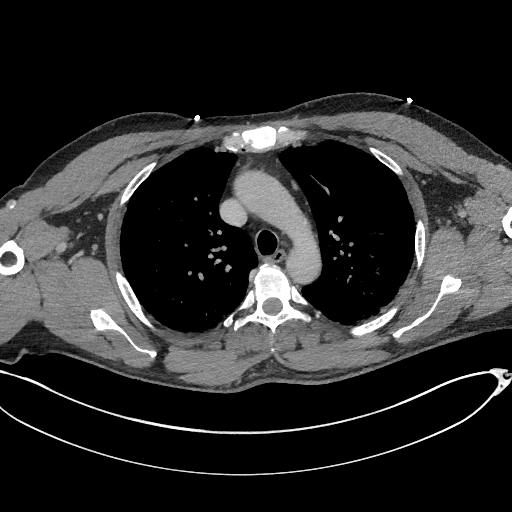
[im 781/815  soft-tissue]
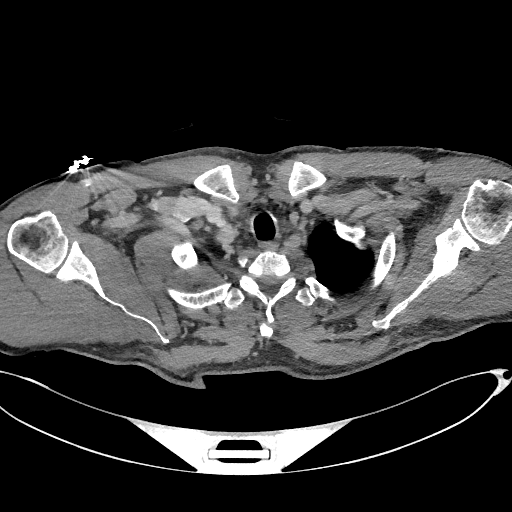

[15 of 46 positions shown; findings below may reference images not displayed]

FINDINGS: CT CHEST FINDINGS

Thyroid gland within normal limits.

No mediastinal, hilar, or axillary adenopathy.

Intrathoracic aorta is of normal caliber without evidence of acute
traumatic injury. Great vessels are intact. No mediastinal hematoma.

Heart size is normal.  Pulmonary arteries within normal limits.

No pneumothorax or pulmonary contusion. Subsegmental atelectasis
seen dependently within the bilateral lower lobes. No focal
infiltrates. No worrisome pulmonary nodule or mass.

No acute fracture within the thorax.

CT ABDOMEN AND PELVIS FINDINGS

The liver is intact. Gallbladder is normal. Spleen is intact.
Adrenal glands within normal limits. Pancreas is unremarkable.
Kidneys demonstrate a normal contrast enhanced appearance without
evidence of acute abnormality. Single 3 mm nonobstructive left renal
calculus noted.

Stomach is normal. No evidence of bowel obstruction or acute bowel
injury. No acute inflammatory changes seen about the bowels. Few
scattered colonic diverticula noted. Fat containing paraumbilical
hernia noted.

Bladder prostate within normal limits.

No free intraperitoneal air.

The intra abdominal aorta is and its branch vessels are intact. Soft
tissue stranding tracking adjacent to the left aspect of the
infrarenal aorta is thought to be secondary to adjacent psoas
hematoma.

No acute pelvic fracture.

There is an acute comminuted fracture through the anterior superior
endplate of L1 with associated 25-30% of height loss. No significant
retropulsion. Faint linear lucencies suggestive of nondisplaced
fractures extending through the bilateral pedicles are suspected. No
significant displacement.

There is a severe burst type fracture of the L3 vertebral body with
approximately 60-70% of height loss. There is approximately 1.2 cm
of retropulsion with resultant severe canal stenosis. The thecal sac
measures approximately 3 mm in AP diameter. There is disruption of
both the anterior and posterior columns. There is disruption of the
posterior column as well as there is an acute nondisplaced fracture
of the right lamina of L3. There are is an associated fracture of
the right transverse process of L3.

There is an associated hematoma within the left psoas musculature.

Curvilinear osseous density along the left lateral aspect of the L4
superior endplate is suspicious for a fractured osteophyte (series
3, image 81). The L4 superior endplate itself appears intact. There
is question of additional small fractures through endplate
osteophytes at the inferior endplate of L2 bilaterally.
IMPRESSION: 1. Acute burst type fracture involving the L3 vertebral body with
associated 1.2 cm of retropulsion and resultant severe central canal
stenosis. There is disruption of all 3 columns, consistent with an
unstable fracture.
2. Acute compression fracture of L1 with associated 25-30% of height
loss without retropulsion.
3. Left psoas hematoma.
4. Acute fracture through the right transverse process of L3.
5. Question fractured osteophyte adjacent to the left lateral aspect
of the L4 superior endplate.
6. No other acute traumatic injury within the chest, abdomen, and
pelvis.
7. 3 mm nonobstructive left renal calculus.

Critical Value/emergent results were called by telephone at the time
of interpretation on 11/27/2013 at [DATE] to Dr. DORUKAN SIMA ,
who verbally acknowledged these results.

## 2016-01-17 DIAGNOSIS — N5089 Other specified disorders of the male genital organs: Secondary | ICD-10-CM | POA: Diagnosis not present

## 2016-01-17 DIAGNOSIS — R030 Elevated blood-pressure reading, without diagnosis of hypertension: Secondary | ICD-10-CM | POA: Diagnosis not present

## 2016-01-17 DIAGNOSIS — Z23 Encounter for immunization: Secondary | ICD-10-CM | POA: Diagnosis not present

## 2016-01-19 ENCOUNTER — Other Ambulatory Visit: Payer: Self-pay | Admitting: Family Medicine

## 2016-01-19 DIAGNOSIS — N5089 Other specified disorders of the male genital organs: Secondary | ICD-10-CM

## 2016-01-21 ENCOUNTER — Ambulatory Visit
Admission: RE | Admit: 2016-01-21 | Discharge: 2016-01-21 | Disposition: A | Discharge status: Home / Self Care | Payer: BLUE CROSS/BLUE SHIELD | Source: Ambulatory Visit | Attending: Family Medicine | Admitting: Family Medicine

## 2016-01-21 DIAGNOSIS — N50811 Right testicular pain: Secondary | ICD-10-CM | POA: Diagnosis not present

## 2016-01-21 DIAGNOSIS — N5089 Other specified disorders of the male genital organs: Secondary | ICD-10-CM

## 2017-02-18 IMAGING — US US ART/VEN ABD/PELV/SCROTUM DOPPLER LTD
1 series · 13 of 25 positions shown · non-contrast
Comparison: CT Abdomen and Pelvis 11/27/2013

CLINICAL DATA: 55-year-old male with non painful palpable
abnormality on the right scrotum discovered 7 months ago. Initial
encounter.

EXAM:
SCROTAL ULTRASOUND
DOPPLER ULTRASOUND OF THE TESTICLES
TECHNIQUE: Complete ultrasound examination of the testicles, epididymis, and
other scrotal structures was performed. Color and spectral Doppler
ultrasound were also utilized to evaluate blood flow to the
testicles.

[Series 1: us art/ven abd/pelv/scrotum doppler ltd · 13 of 39 slices shown]
[im 1/39]
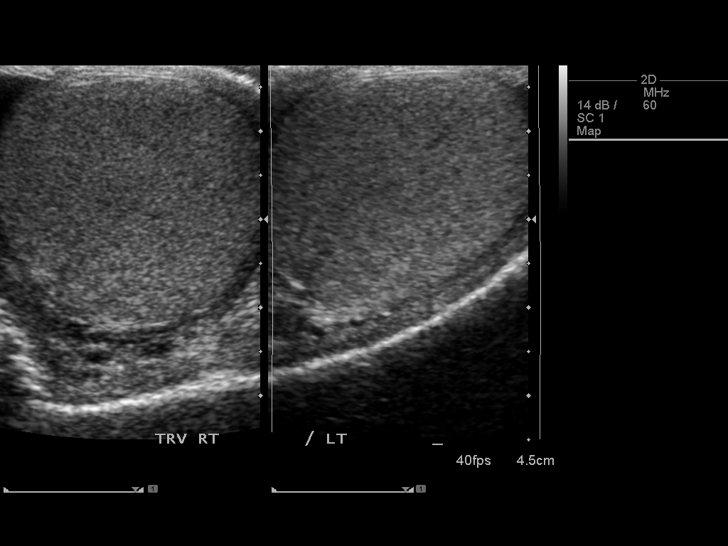
[im 4/39]
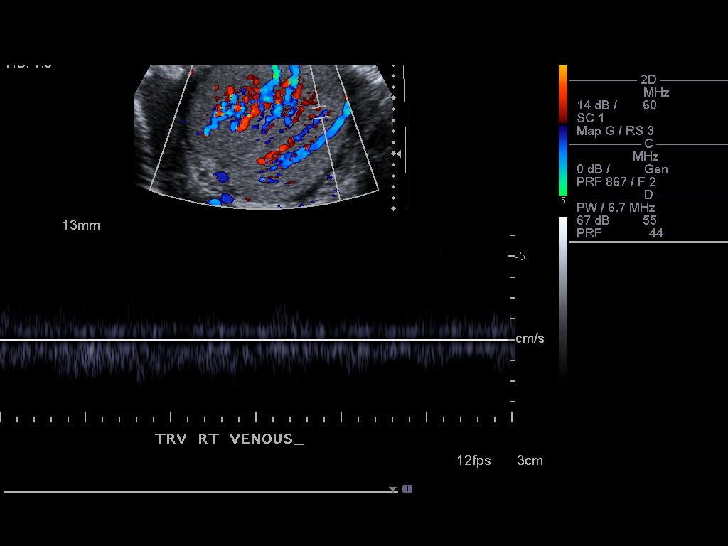
[im 7/39]
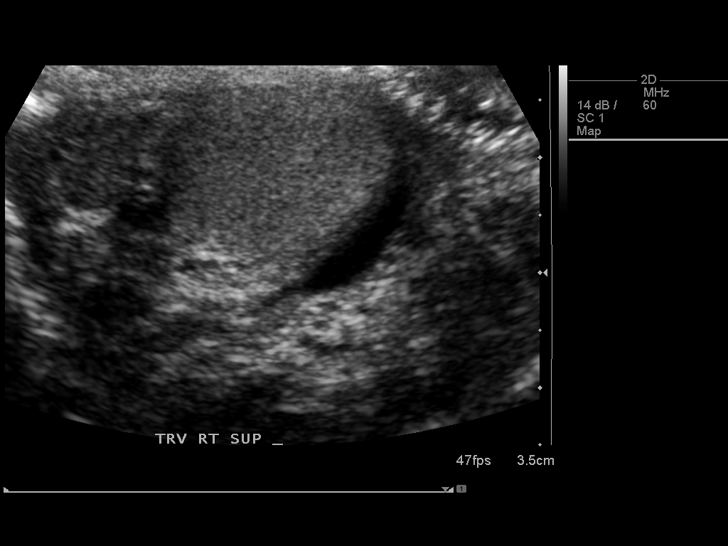
[im 10/39]
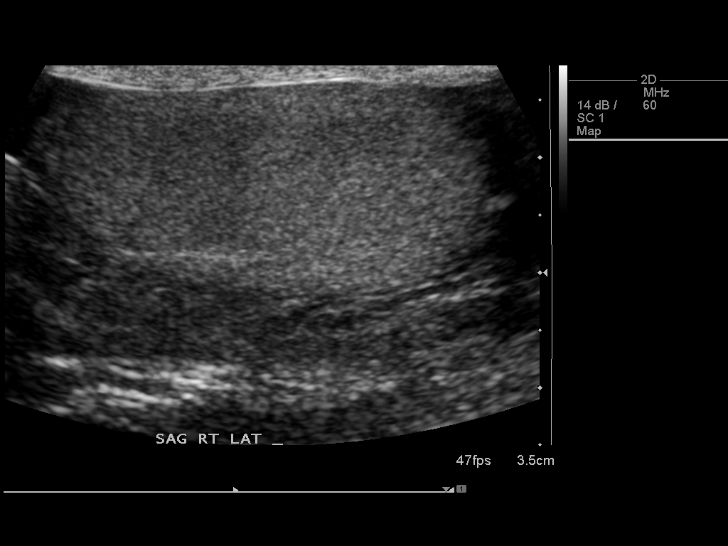
[im 13/39]
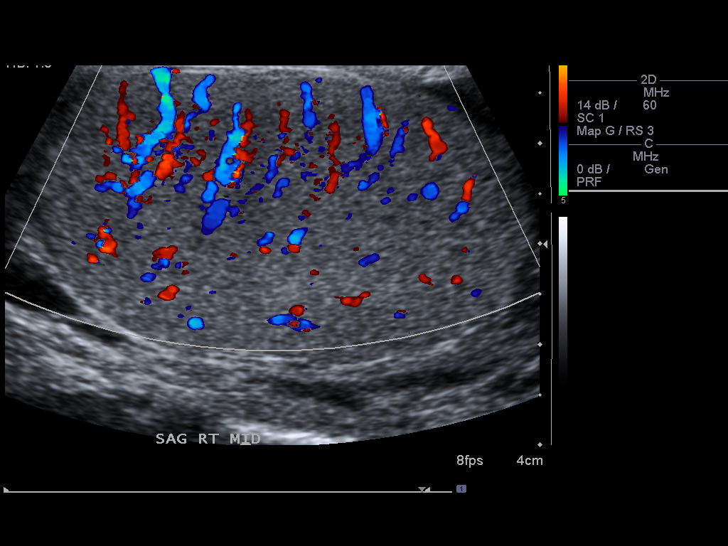
[im 16/39]
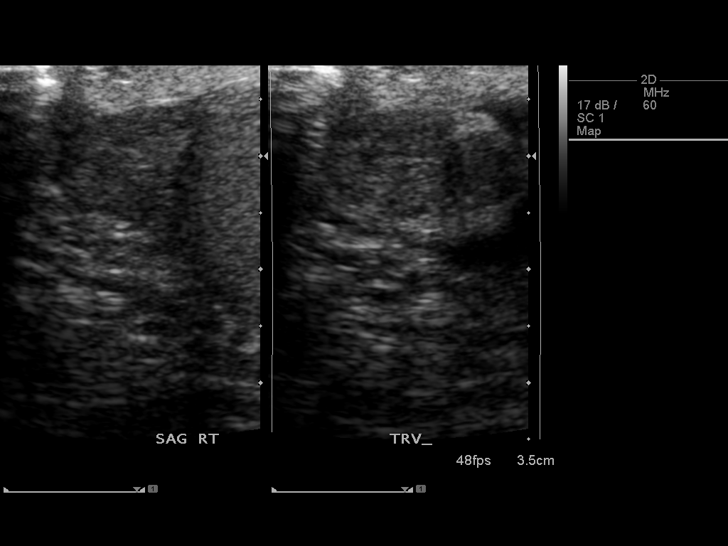
[im 20/39]
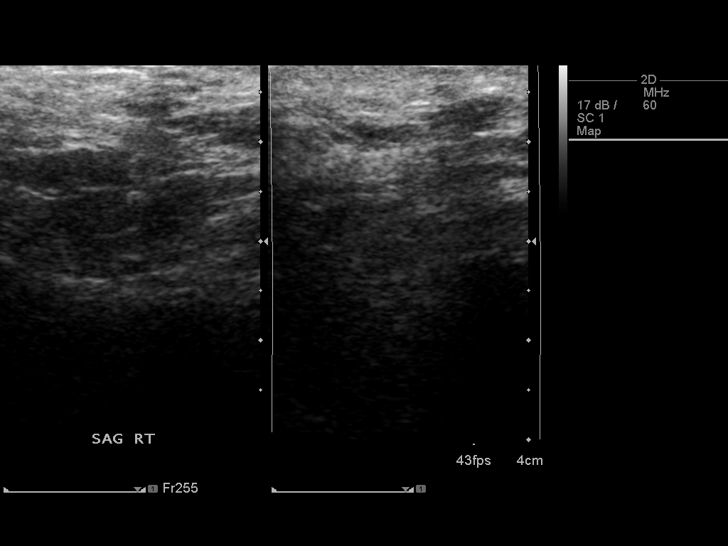
[im 23/39]
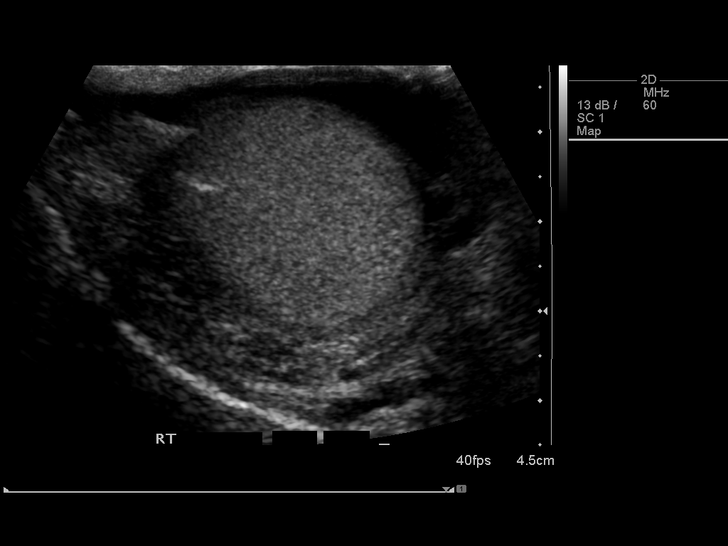
[im 26/39]
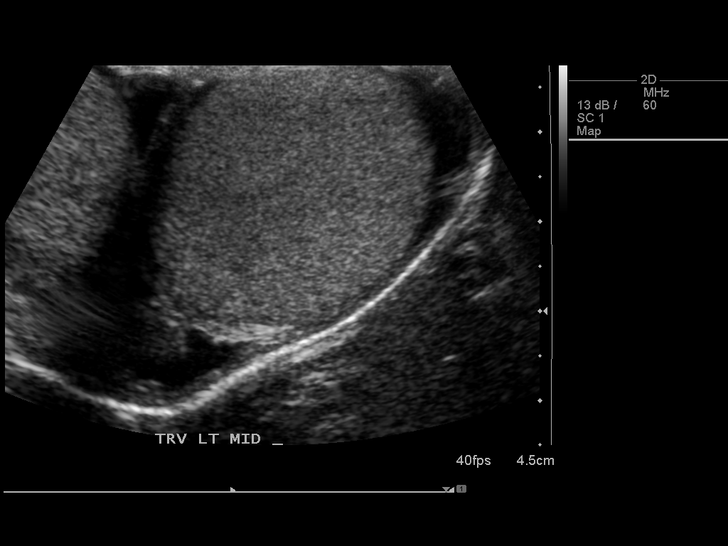
[im 29/39]
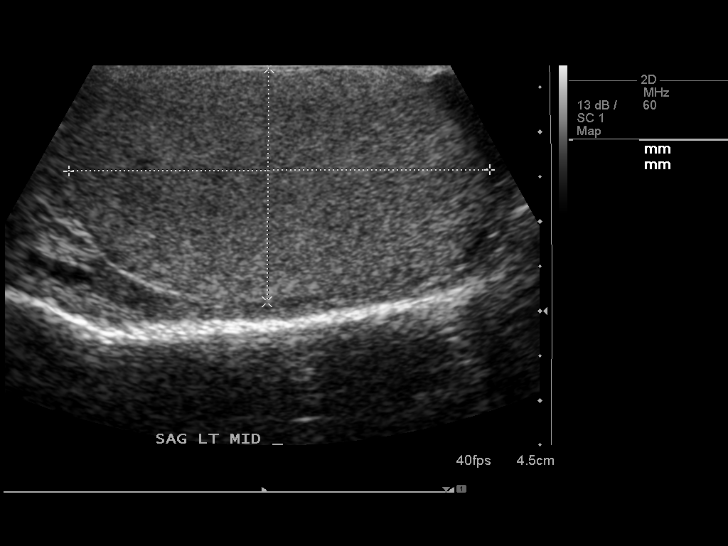
[im 32/39]
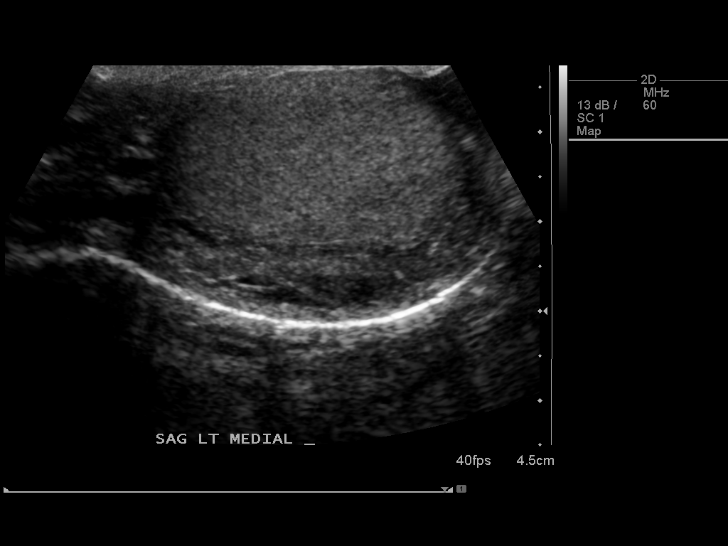
[im 35/39]
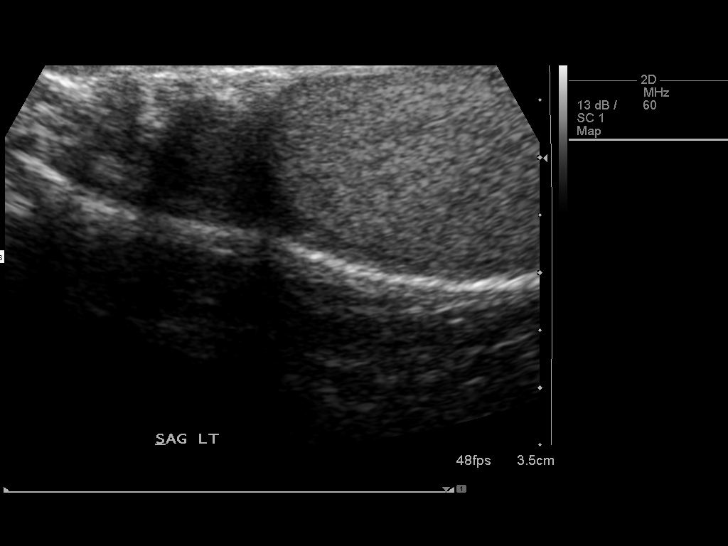
[im 39/39]
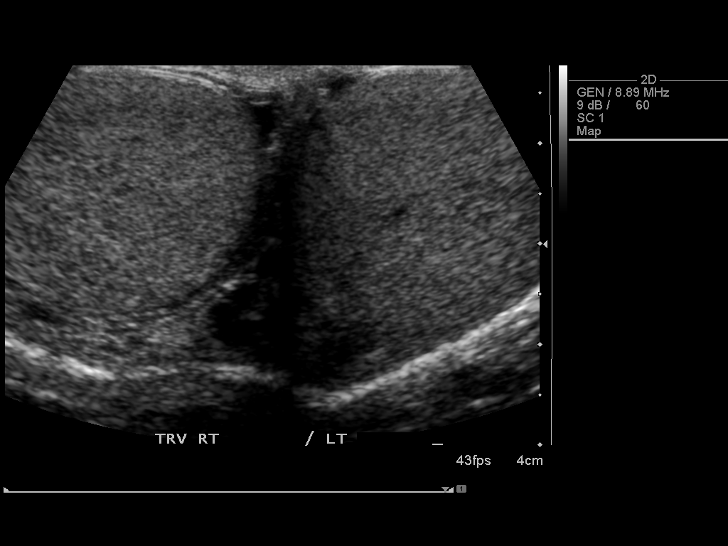

[13 of 25 positions shown; findings below may reference images not displayed]

FINDINGS: Right testicle

Measurements: 5.0 x 2.5 x 3.2 cm. No mass or microlithiasis
visualized.

Left testicle

Measurements: 4.7 x 2.6 x 2.9 cm. No mass or microlithiasis
visualized.

Right epididymis:  Normal in size and appearance.

Left epididymis:  Normal in size and appearance.

Hydrocele: Minimal and symmetric appearing bilateral hydroceles with
simple fluid.

Varicocele:  None visualized.

Pulsed Doppler interrogation of both testes demonstrates normal low
resistance arterial and venous waveforms bilaterally.

Other findings: No asymmetry or sonographic abnormality is
identified in the palpable area of concern (images 23-25).
IMPRESSION: 1. No definite sonographic abnormality to correspond to the palpable
area of concern; there is a small simple appearing right hydrocele,
but a similar size left hydrocele is also noted and these appear
inconsequential.
2. Otherwise negative scrotal ultrasound with Doppler. No testicular
torsion.

## 2017-04-06 DIAGNOSIS — M25562 Pain in left knee: Secondary | ICD-10-CM | POA: Diagnosis not present

## 2017-04-20 ENCOUNTER — Encounter: Payer: Self-pay | Admitting: Cardiovascular Disease

## 2017-04-20 DIAGNOSIS — R001 Bradycardia, unspecified: Secondary | ICD-10-CM | POA: Diagnosis not present

## 2017-04-20 DIAGNOSIS — R42 Dizziness and giddiness: Secondary | ICD-10-CM | POA: Diagnosis not present

## 2017-04-20 DIAGNOSIS — R030 Elevated blood-pressure reading, without diagnosis of hypertension: Secondary | ICD-10-CM | POA: Diagnosis not present

## 2017-04-24 ENCOUNTER — Telehealth: Payer: Self-pay | Admitting: *Deleted

## 2017-04-24 DIAGNOSIS — M25562 Pain in left knee: Secondary | ICD-10-CM | POA: Diagnosis not present

## 2017-04-24 NOTE — Telephone Encounter (Signed)
Referral sent to scheduling. 

## 2017-04-27 DIAGNOSIS — R030 Elevated blood-pressure reading, without diagnosis of hypertension: Secondary | ICD-10-CM | POA: Diagnosis not present

## 2017-04-27 DIAGNOSIS — I10 Essential (primary) hypertension: Secondary | ICD-10-CM | POA: Diagnosis not present

## 2017-04-27 DIAGNOSIS — R42 Dizziness and giddiness: Secondary | ICD-10-CM | POA: Diagnosis not present

## 2017-05-28 ENCOUNTER — Encounter: Payer: Self-pay | Admitting: Cardiovascular Disease

## 2017-05-28 ENCOUNTER — Ambulatory Visit (INDEPENDENT_AMBULATORY_CARE_PROVIDER_SITE_OTHER): Payer: BLUE CROSS/BLUE SHIELD | Admitting: Cardiovascular Disease

## 2017-05-28 ENCOUNTER — Encounter (INDEPENDENT_AMBULATORY_CARE_PROVIDER_SITE_OTHER): Payer: Self-pay

## 2017-05-28 VITALS — BP 135/89 | HR 77 | Ht 69.0 in | Wt 193.6 lb

## 2017-05-28 DIAGNOSIS — I10 Essential (primary) hypertension: Secondary | ICD-10-CM | POA: Diagnosis not present

## 2017-05-28 DIAGNOSIS — R42 Dizziness and giddiness: Secondary | ICD-10-CM | POA: Diagnosis not present

## 2017-05-28 DIAGNOSIS — Z13220 Encounter for screening for lipoid disorders: Secondary | ICD-10-CM | POA: Diagnosis not present

## 2017-05-28 DIAGNOSIS — R001 Bradycardia, unspecified: Secondary | ICD-10-CM

## 2017-05-28 DIAGNOSIS — F1010 Alcohol abuse, uncomplicated: Secondary | ICD-10-CM | POA: Diagnosis not present

## 2017-05-28 LAB — LIPID PANEL
Chol/HDL Ratio: 3 ratio (ref 0.0–5.0)
Cholesterol, Total: 237 mg/dL — ABNORMAL HIGH (ref 100–199)
HDL: 80 mg/dL (ref >39)
LDL Calculated: 138 mg/dL — ABNORMAL HIGH (ref 0–99)
TRIGLYCERIDES: 94 mg/dL (ref 0–149)
VLDL Cholesterol Cal: 19 mg/dL (ref 5–40)

## 2017-05-28 NOTE — Patient Instructions (Signed)
Medication Instructions:  Your physician recommends that you continue on your current medications as directed. Please refer to the Current Medication list given to you today.  Labwork: Today (Lipid)  Testing/Procedures: Your physician has requested that you have an echocardiogram. Echocardiography is a painless test that uses sound waves to create images of your heart. It provides your doctor with information about the size and shape of your heart and how well your heart's chambers and valves are working. This procedure takes approximately one hour. There are no restrictions for this procedure. [facility]  [employer]  Follow-Up: As needed (unless testing abnormal)  Any Other Special Instructions Will Be Listed Below (If Applicable).     If you need a refill on your cardiac medications before your next appointment, please call your pharmacy.

## 2017-05-28 NOTE — Progress Notes (Signed)
Cardiology Office Note    Date:  06/03/2017   ID:  Billy Collins, DOB [birth year], MRN [id number]  PCP:  Antony Contras, MD  Cardiologist:  Shelva Majestic, MD   Chief Complaint  Patient presents with  . New Patient (Initial Visit)   New cardiology consultation referred through the courtesy of Marilynne Drivers, PA [facility] for evaluation of recent bradycardia and mild dizziness.  History of Present Illness:  Billy Collins is a 57 y.o. male who has a history of hypertension and is referred by Marilynne Drivers, PA aeration of bradycardia and mild dizziness.  Billy Collins is doing awareness of elevated blood pressure had not been treated for this until his recent evaluation on April 20, 2017 by Marilynne Drivers, PA.  Time, he presented to the [facility] office with complaints of dizziness.  Scribe this room spinning associated with mild nausea and also experienced a mild headache.  On evaluating him, his blood pressure was elevated at 160/100.  An ECG was done which revealed sinus bradycardia at 46 bpm.  Was no ectopy.  Started on amlodipine 5 mg in addition to meclizine.  Billy Collins was obtained showed a glucose of 104.  TSH 1.86.  Globin 16.6, hematocrit 49.6.  Normal electrolytes and renal function.  He was referred for cardiology evaluation.  Since initiating meclizine, his dizziness symptoms resolved.  He has been taking amlodipine.  He denies  any chest pain or palpitations.  He denies any PND orthopnea.  There is no presyncope or syncope.  He recently had an injection into his left knee started on ibuprofen.    He presents now for cardiology consultation.   No past medical history on file.  Past Surgical History:  Procedure Laterality Date  . POSTERIOR LUMBAR FUSION 4 LEVEL N/A 11/29/2013   Procedure: LUMBAR LAMINECTOMY/DECOMPRESSION WITH INSTRUMENTATION FUSION L1-L5;  Surgeon: Newman Pies, MD;  Location: [facility];  Service: Neurosurgery;  Laterality: N/A;    Current Medications: Outpatient Medications Prior  to Visit  Medication Sig Dispense Refill  . amLODipine (NORVASC) 5 MG tablet     . cephALEXin (KEFLEX) 500 MG capsule Take 1 capsule (500 mg total) by mouth 4 (four) times daily. 28 capsule 0  . diazepam (VALIUM) 5 MG tablet Take 1 tablet (5 mg total) by mouth every 6 (six) hours as needed for muscle spasms. 50 tablet 1  . docusate sodium 100 MG CAPS Take 100 mg by mouth 2 (two) times daily. 60 capsule 0  . oxyCODONE-acetaminophen (PERCOCET) 10-325 MG per tablet Take 1 tablet by mouth every 4 (four) hours as needed for pain. 100 tablet 0  . polyethylene glycol (MIRALAX / GLYCOLAX) packet Take 17 g by mouth daily. 14 each 0   No facility-administered medications prior to visit.      Allergies:   Patient has no known allergies.   Social History   Socioeconomic History  . Marital status: Divorced    Spouse name: None  . Number of children: None  . Years of education: None  . Highest education level: None  Social Needs  . Financial resource strain: None  . Food insecurity - worry: None  . Food insecurity - inability: None  . Transportation needs - medical: None  . Transportation needs - non-medical: None  Occupational History  . None  Tobacco Use  . Smoking status: Former Research scientist (life sciences)  . Smokeless tobacco: Never Used  Substance and Sexual Activity  . Alcohol use: No  . Drug use: No  . Sexual  activity: None  Other Topics Concern  . None  Social History Narrative  . None     Additional social history is notable in that he has 2 children and 4 grandchildren.  He lives with his girlfriend and mother.  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  He completed high school.  No tobacco use.  He drinks at least a 6 pack of Bud Light per night.  Family History:  The patient's family history is not on file.   Family history is notable that his mother is living at age 44.  Father died in his 9s.  One brother age 58.  ROS General: Negative; No fevers, chills, or night sweats;  HEENT: Negative;  No changes in vision or hearing, sinus congestion, difficulty swallowing Pulmonary: Negative; No cough, wheezing, shortness of breath, hemoptysis Cardiovascular: See HPI GI: Negative; No nausea, vomiting, diarrhea, or abdominal pain GU: Negative; No dysuria, hematuria, or difficulty voiding Musculoskeletal: Left knee discomfort Hematologic/Oncology: Negative; no easy bruising, bleeding Endocrine: Negative; no heat/cold intolerance; no diabetes Neuro: Negative; no changes in balance, headaches Skin: Negative; No rashes or skin lesions Psychiatric: Negative; No behavioral problems, depression Sleep: Negative; No snoring, daytime sleepiness, hypersomnolence, bruxism, restless legs, hypnogognic hallucinations, no cataplexy Other comprehensive 14 point system review is negative.   PHYSICAL EXAM:   VS:  BP 135/89   Pulse 77   Ht '5\' 9"'  (1.753 m)   Wt 193 lb 9.6 oz (87.8 kg)   BMI 28.59 kg/m     Repeat blood pressure by me was 122/82 supine and 126/84  Wt Readings from Last 3 Encounters:  05/28/17 193 lb 9.6 oz (87.8 kg)  11/29/13 189 lb (85.7 kg)    General: Alert, oriented, no distress.  Skin: normal turgor, no rashes, warm and dry HEENT: Normocephalic, atraumatic. Pupils equal round and reactive to light; sclera anicteric; extraocular muscles intact; Fundi normal Nose without nasal septal hypertrophy Mouth/Parynx benign; Mallinpatti scale 2 Neck: No JVD, no carotid bruits; normal carotid upstroke Lungs: clear to ausculatation and percussion; no wheezing or rales Chest wall: without tenderness to palpitation Heart: PMI not displaced, RRR, s1 s2 normal, no ectopy; 1/6 systolic murmur, no diastolic murmur, no rubs, gallops, thrills, or heaves Abdomen: soft, nontender; no hepatosplenomehaly, BS+; abdominal aorta nontender and not dilated by palpation. Back: no CVA tenderness Pulses 2+ Musculoskeletal: full range of motion, normal strength, no joint deformities Extremities: no  clubbing cyanosis or edema, Homan's sign negative  Neurologic: grossly nonfocal; Cranial nerves grossly wnl Psychologic: Normal mood and affect   Studies/Labs Reviewed:   EKG:  EKG is ordered today.  ECG (independently read by me): Sinus rhythm at 67 bpm.  PR interval  164 ms, QTc interval '3 9 5 ' ms.  No ectopy.  No ST segment changes.  Recent Labs: BMP Latest Ref Rng & Units 11/30/2013 11/27/2013 11/27/2013  Glucose 70 - 99 mg/dL 117(H) 110(H) 110(H)  BUN 6 - 23 mg/dL 17 24(H) 23  Creatinine 0.50 - 1.35 mg/dL 0.77 1.00 0.97  Sodium 137 - 147 mEq/L 139 141 142  Potassium 3.7 - 5.3 mEq/L 4.4 3.6(L) 3.8  Chloride 96 - 112 mEq/L 104 109 107  CO2 19 - 32 mEq/L 24 - 20  Calcium 8.4 - 10.5 mg/dL 8.7 - 9.4     Hepatic Function Latest Ref Rng & Units 11/27/2013  Total Protein 6.0 - 8.3 g/dL 7.0  Albumin 3.5 - 5.2 g/dL 3.9  AST 0 - 37 U/L 28  ALT 0 -  53 U/L 25  Alk Phosphatase 39 - 117 U/L 60  Total Bilirubin 0.3 - 1.2 mg/dL 0.7    CBC Latest Ref Rng & Units 11/30/2013 11/27/2013 11/27/2013  WBC 4.0 - 10.5 K/uL 12.1(H) - 14.8(H)  Hemoglobin 13.0 - 17.0 g/dL 11.1(L) 15.6 14.8  Hematocrit 39.0 - 52.0 % 32.0(L) 46.0 41.7  Platelets 150 - 400 K/uL 148(L) - 181   Lab Results  Component Value Date   MCV 92.0 11/30/2013   MCV 91.0 11/27/2013   No results found for: TSH No results found for: HGBA1C   BNP No results found for: BNP  ProBNP No results found for: PROBNP   Lipid Panel     Component Value Date/Time   CHOL 237 (H) 05/28/2017 1041   TRIG 94 05/28/2017 1041   HDL 80 05/28/2017 1041   CHOLHDL 3.0 05/28/2017 1041   LDLCALC 138 (H) 05/28/2017 1041     RADIOLOGY: No results found.   Additional studies/ records that were reviewed today include:  Records from [facility]:    1. Essential hypertension   2. Lipid screening   3. Dizziness   4. Daily ETOH       PLAN:  Billy Collins is a 57 year old man [place] Baseman that his blood pressure may have been elevated  for the past years but he was never started on medication.  Was recently found to have stage II hypertension on presentation on April 20, 2017 when he was evaluated for dizziness.  His symptoms were suggestive of vertigo with room spinning and these have completely resolved with seen.  He was started on amlodipine 5 mg for blood pressure of 160/100.  His blood pressure today is stable and there is no signs of orthostatic changes.  ECG today shows sinus rhythm without bradycardia.  I personally reviewed the ECG from [facility] which otherwise was normal except his heart rate was 46 bpm.  He had normal intervals.  Records indicate remotely he had hyperlipidemia in 2015 with a total cholesterol of 204 and LDL cholesterol of 127.  Not had recent lipid studies obtained.  I reviewed the recent laboratory otherwise is stable.  He is fasting today and I will obtain a fasting lipid panel.  He does have a soft cardiac murmur.  I am scheduling him for 2D echo Doppler study.  Not routinely exercise but stays busy working 5 days/week for 8 hours a day.  He also drinks a significant amount of beer on a daily basis, and typically at least a sixpack or more.  I discussed the importance of reducing his EtOH intake.  We also discussed the importance of additional exercise besides work and ideally at least 5 days/week for 30 minutes at a time of moderate intensity.   Medication Adjustments/Labs and Tests Ordered: Current medicines are reviewed at length with the patient today.  Concerns regarding medicines are outlined above.  Medication changes, Labs and Tests ordered today are listed in the Patient Instructions below. Patient Instructions  Medication Instructions:  Your physician recommends that you continue on your current medications as directed. Please refer to the Current Medication list given to you today.  Labwork: Today (Lipid)  Testing/Procedures: Your physician has requested that you have an echocardiogram.  Echocardiography is a painless test that uses sound waves to create images of your heart. It provides your doctor with information about the size and shape of your heart and how well your heart's chambers and valves are working. This procedure takes approximately  one hour. There are no restrictions for this procedure. This will be done at our [facility] location:  [place]: As needed (unless testing abnormal)  Any Other Special Instructions Will Be Listed Below (If Applicable).     If you need a refill on your cardiac medications before your next appointment, please call your pharmacy.      Signed, Shelva Majestic, MD  06/03/2017 9:46 AM    [facility] [address] [place], [place], [place]  [address] Phone: [phone]

## 2017-06-03 ENCOUNTER — Encounter: Payer: Self-pay | Admitting: Cardiovascular Disease

## 2017-06-06 ENCOUNTER — Other Ambulatory Visit (HOSPITAL_COMMUNITY): Payer: BLUE CROSS/BLUE SHIELD

## 2017-06-06 DIAGNOSIS — M238X2 Other internal derangements of left knee: Secondary | ICD-10-CM | POA: Diagnosis not present

## 2017-06-07 DIAGNOSIS — M2392 Unspecified internal derangement of left knee: Secondary | ICD-10-CM | POA: Insufficient documentation

## 2017-06-20 ENCOUNTER — Ambulatory Visit (HOSPITAL_COMMUNITY): Payer: BLUE CROSS/BLUE SHIELD

## 2017-07-26 ENCOUNTER — Encounter: Payer: Self-pay | Admitting: Cardiovascular Disease

## 2017-08-22 ENCOUNTER — Encounter: Payer: Self-pay | Admitting: *Deleted

## 2017-08-29 ENCOUNTER — Telehealth: Payer: Self-pay | Admitting: *Deleted

## 2017-08-29 DIAGNOSIS — I10 Essential (primary) hypertension: Secondary | ICD-10-CM | POA: Diagnosis not present

## 2017-08-29 DIAGNOSIS — E785 Hyperlipidemia, unspecified: Secondary | ICD-10-CM

## 2017-08-29 DIAGNOSIS — Z79899 Other long term (current) drug therapy: Secondary | ICD-10-CM

## 2017-08-29 DIAGNOSIS — E7800 Pure hypercholesterolemia, unspecified: Secondary | ICD-10-CM | POA: Diagnosis not present

## 2017-08-29 MED ORDER — ROSUVASTATIN CALCIUM 20 MG PO TABS: 20.0000 mg | ORAL_TABLET | Freq: Every day | ORAL | 3 refills | Status: DC

## 2017-08-29 NOTE — Telephone Encounter (Signed)
Notes recorded by Lennette BihariKelly, Thomas A, MD on 07/22/2017 at 9:55 PM EDT Lipid studies increased with total cholesterol 237, LDL 138. Need treatment. If patient is interested, consider rosuvastatin 20 mg. Otherwise, optimize diet and recheck in approximately 4 months    Patient received letter called in regards to results.    He is aware and verbalized understanding,  He is agreeable to starting rosuvastatin.   Rx sent to pharmacy.    Repeat labs ordered for 3-4 months and will place recall for October.    Patient states he has not had his echo at this time as he was "running out of money".     Encouraged patient to have test completed if able, if not Dr. Tresa EndoKelly will reevaluate in October.   Pt verbalized understanding.

## 2018-03-01 DIAGNOSIS — R7301 Impaired fasting glucose: Secondary | ICD-10-CM | POA: Diagnosis not present

## 2018-03-01 DIAGNOSIS — E7800 Pure hypercholesterolemia, unspecified: Secondary | ICD-10-CM | POA: Diagnosis not present

## 2018-03-01 DIAGNOSIS — I10 Essential (primary) hypertension: Secondary | ICD-10-CM | POA: Diagnosis not present

## 2018-05-22 DIAGNOSIS — K648 Other hemorrhoids: Secondary | ICD-10-CM | POA: Diagnosis not present

## 2018-06-07 ENCOUNTER — Ambulatory Visit: Payer: BLUE CROSS/BLUE SHIELD | Admitting: Cardiovascular Disease

## 2018-08-30 DIAGNOSIS — Z0000 Encounter for general adult medical examination without abnormal findings: Secondary | ICD-10-CM | POA: Diagnosis not present

## 2018-08-30 DIAGNOSIS — Z1159 Encounter for screening for other viral diseases: Secondary | ICD-10-CM | POA: Diagnosis not present

## 2018-08-30 DIAGNOSIS — I10 Essential (primary) hypertension: Secondary | ICD-10-CM | POA: Diagnosis not present

## 2018-08-30 DIAGNOSIS — E7800 Pure hypercholesterolemia, unspecified: Secondary | ICD-10-CM | POA: Diagnosis not present

## 2018-08-30 DIAGNOSIS — M79676 Pain in unspecified toe(s): Secondary | ICD-10-CM | POA: Diagnosis not present

## 2018-08-30 DIAGNOSIS — Z125 Encounter for screening for malignant neoplasm of prostate: Secondary | ICD-10-CM | POA: Diagnosis not present

## 2018-09-13 ENCOUNTER — Other Ambulatory Visit: Payer: Self-pay

## 2018-09-13 ENCOUNTER — Ambulatory Visit (INDEPENDENT_AMBULATORY_CARE_PROVIDER_SITE_OTHER): Payer: BC Managed Care – PPO | Admitting: Podiatry

## 2018-09-13 ENCOUNTER — Encounter: Payer: Self-pay | Admitting: Podiatry

## 2018-09-13 ENCOUNTER — Ambulatory Visit (INDEPENDENT_AMBULATORY_CARE_PROVIDER_SITE_OTHER): Payer: BC Managed Care – PPO

## 2018-09-13 VITALS — BP 144/99 | HR 62 | Temp 98.5°F | Resp 16

## 2018-09-13 DIAGNOSIS — M2041 Other hammer toe(s) (acquired), right foot: Secondary | ICD-10-CM

## 2018-09-13 DIAGNOSIS — M2042 Other hammer toe(s) (acquired), left foot: Secondary | ICD-10-CM

## 2018-09-13 NOTE — Progress Notes (Signed)
Subjective:   Patient ID: Billy Collins, male   DOB: 58 y.o.   MRN: [id number]   HPI Patient states his right second toe has really lifted in the air over approximate last 8 months to 1 year and it is been hurting him since and it is also really starting her on top of the toe.  He does not know why this happened but it is become increasingly tender for him and making it difficult to wear shoe gear or ambulate properly.  Patient does not smoke likes to be active   Review of Systems  All other systems reviewed and are negative.       Objective:  Physical Exam Vitals signs and nursing note reviewed.  Constitutional:      Appearance: He is well-developed.  Pulmonary:     Effort: Pulmonary effort is normal.  Musculoskeletal: Normal range of motion.  Skin:    General: Skin is warm.  Neurological:     Mental Status: He is alert.     Neurovascular status intact muscle strength is adequate range of motion within normal limits with patient noted to have a severe rigid contracture digit to right at the MPJ with a deformity of the inner phalangeal joint with rigid deformity of the proximal to phalangeal joint.  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  We discussed nature of deformity in nature of flexor plate deformity and patient wants to have this fixed and I recommended digital fusion along with metatarsal osteotomy.  I spent a great deal time going over there is no guarantees as to the position we could place him as far as the toe goes and I allowed him to read consent form going over alternative treatments complications.  After review he signed consent form and understands recovery can take 6  months to 1 year and I did dispense air fracture walker today and gave him instructions to call if any questions were to come up before the surgery in the next several weeks  X-ray indicates that there is severe dorsal contracture digit to right over left with pressure against the metatarsal phalangeal joint

## 2018-09-13 NOTE — Progress Notes (Signed)
   Subjective:    Patient ID: Billy Collins, male    DOB: 12-Dec-1960, 58 y.o.   MRN: [id number]  HPI    Review of Systems  All other systems reviewed and are negative.      Objective:   Physical Exam        Assessment & Plan:

## 2018-09-13 NOTE — Patient Instructions (Addendum)
Pre-Operative Instructions  Congratulations, you have decided to take an important step towards improving your quality of life.  You can be assured that the doctors and staff at [facility] will be with you every step of the way.  Here are some important things you should know:  1. Plan to be at the surgery center/hospital at least 1 (one) hour prior to your scheduled time, unless otherwise directed by the surgical center/hospital staff.  You must have a responsible adult accompany you, remain during the surgery and drive you home.  Make sure you have directions to the surgical center/hospital to ensure you arrive on time. 2. If you are having surgery at [facility] or [facility], you will need a copy of your medical history and physical form from your family physician within one month prior to the date of surgery. We will give you a form for your primary physician to complete.  3. We make every effort to accommodate the date you request for surgery.  However, there are times where surgery dates or times have to be moved.  We will contact you as soon as possible if a change in schedule is required.   4. No aspirin/ibuprofen for one week before surgery.  If you are on aspirin, any non-steroidal anti-inflammatory medications (Mobic, Aleve, Ibuprofen) should not be taken seven (7) days prior to your surgery.  You make take Tylenol for pain prior to surgery.  5. Medications - If you are taking daily heart and blood pressure medications, seizure, reflux, allergy, asthma, anxiety, pain or diabetes medications, make sure you notify the surgery center/hospital before the day of surgery so they can tell you which medications you should take or avoid the day of surgery. 6. No food or drink after midnight the night before surgery unless directed otherwise by surgical center/hospital staff. 7. No alcoholic beverages 78-MVEHM prior to surgery.  No smoking 24-hours prior or 24-hours after surgery. 8.  Wear loose pants or shorts. They should be loose enough to fit over bandages, boots, and casts. 9. Don't wear slip-on shoes. Sneakers are preferred. 10. Bring your boot with you to the surgery center/hospital.  Also bring crutches or a walker if your physician has prescribed it for you.  If you do not have this equipment, it will be provided for you after surgery. 11. If you have not been contacted by the surgery center/hospital by the day before your surgery, call to confirm the date and time of your surgery. 12. Leave-time from work may vary depending on the type of surgery you have.  Appropriate arrangements should be made prior to surgery with your employer. 13. Prescriptions will be provided immediately following surgery by your doctor.  Fill these as soon as possible after surgery and take the medication as directed. Pain medications will not be refilled on weekends and must be approved by the doctor. 14. Remove nail polish on the operative foot and avoid getting pedicures prior to surgery. 15. Wash the night before surgery.  The night before surgery wash the foot and leg well with water and the antibacterial soap provided. Be sure to pay special attention to beneath the toenails and in between the toes.  Wash for at least three (3) minutes. Rinse thoroughly with water and dry well with a towel.  Perform this wash unless told not to do so by your physician.  Enclosed: 1 Ice pack (please put in freezer the night before surgery)   1 Hibiclens skin cleaner  Pre-op instructions  If you have any questions regarding the instructions, please do not hesitate to call our office.  [place]: [address], [place], [place] [address] -- [place]: [address] [place], [place] [address] -- [phone]  [place]: [place], [place] [address] -- [phone]  [place]: [address] [place], [place], [place] [address] -- [phone]  Website:  [email or web address]  Hammer Toe  Hammer toe is a change in the shape (a deformity) of your toe. The deformity causes the middle joint of your toe to stay bent. This causes pain, especially when you are wearing shoes. Hammer toe starts gradually. At first, the toe can be straightened. Gradually over time, the deformity becomes stiff and permanent. Early treatments to keep the toe straight may relieve pain. As the deformity becomes stiff and permanent, surgery may be needed to straighten the toe. What are the causes? Hammer toe is caused by abnormal bending of the toe joint that is closest to your foot. It happens gradually over time. This pulls on the muscles and connections (tendons) of the toe joint, making them weak and stiff. It is often related to wearing shoes that are too short or narrow and do not let your toes straighten. What increases the risk? You may be at greater risk for hammer toe if you:  Are male.  Are older.  Wear shoes that are too small.  Wear high-heeled shoes that pinch your toes.  Are a Engineer, mining.  Have a second toe that is longer than your big toe (first toe).  Injure your foot or toe.  Have arthritis.  Have a family history of hammer toe.  Have a nerve or muscle disorder. What are the signs or symptoms? The main symptoms of this condition are pain and deformity of the toe. The pain is worse when wearing shoes, walking, or running. Other symptoms may include:  Corns or calluses over the bent part of the toe or between the toes.  Redness and a burning feeling on the toe.  An open sore that forms on the top of the toe.  Not being able to straighten the toe. How is this diagnosed? This condition is diagnosed based on your symptoms and a physical exam. During the exam, your health care provider will try to straighten your toe to see how stiff the deformity is. You may also have tests, such as:  A blood test to check for rheumatoid arthritis.   An X-ray to show how severe the deformity is. How is this treated? Treatment for this condition will depend on how stiff the deformity is. Surgery is often needed. However, sometimes a hammer toe can be straightened without surgery. Treatments that do not involve surgery include:  Taping the toe into a straightened position.  Using pads and cushions to protect the toe (orthotics).  Wearing shoes that provide enough room for the toes.  Doing toe-stretching exercises at home.  Taking an NSAID to reduce pain and swelling. If these treatments do not help or the toe cannot be straightened, surgery is the next option. The most common surgeries used to straighten a hammer toe include:  Arthroplasty. In this procedure, part of the joint is removed, and that allows the toe to straighten.  Fusion. In this procedure, cartilage between the two bones of the joint is taken out and the bones are fused together into one longer bone.  Implantation. In this procedure, part of the bone is removed and replaced  with an implant to let the toe move again.  Flexor tendon transfer. In this procedure, the tendons that curl the toes down (flexor tendons) are repositioned. Follow these instructions at home:  Take over-the-counter and prescription medicines only as told by your health care provider.  Do toe straightening and stretching exercises as told by your health care provider.  Keep all follow-up visits as told by your health care provider. This is important. How is this prevented?  Wear shoes that give your toes enough room and do not cause pain.  Do not wear high-heeled shoes. Contact a health care provider if:  Your pain gets worse.  Your toe becomes red or swollen.  You develop an open sore on your toe. This information is not intended to replace advice given to you by your health care provider. Make sure you discuss any questions you have with your health care provider. Document Released:  03/03/2000 Document Revised: 02/16/2017 Document Reviewed: 06/30/2015 Elsevier Patient Education  2020 [employer]

## 2018-09-18 DIAGNOSIS — D120 Benign neoplasm of cecum: Secondary | ICD-10-CM | POA: Diagnosis not present

## 2018-09-18 DIAGNOSIS — Z1211 Encounter for screening for malignant neoplasm of colon: Secondary | ICD-10-CM | POA: Diagnosis not present

## 2018-09-18 DIAGNOSIS — D125 Benign neoplasm of sigmoid colon: Secondary | ICD-10-CM | POA: Diagnosis not present

## 2018-09-18 DIAGNOSIS — D124 Benign neoplasm of descending colon: Secondary | ICD-10-CM | POA: Diagnosis not present

## 2018-09-18 DIAGNOSIS — K640 First degree hemorrhoids: Secondary | ICD-10-CM | POA: Diagnosis not present

## 2018-09-25 ENCOUNTER — Telehealth: Payer: Self-pay | Admitting: *Deleted

## 2018-09-25 NOTE — Telephone Encounter (Signed)
"  I'm scheduled for surgery on July 21.  I hadn't heard anything about the time I'm supposed to be there or anything."  Someone from the surgical center will call you the Friday or Monday prior to your surgery date and will give you your arrival time.  "They'll call me?"  Yes, they will call you.  "Alright, I appreciate it."

## 2018-09-27 ENCOUNTER — Telehealth: Payer: Self-pay | Admitting: *Deleted

## 2018-09-27 NOTE — Telephone Encounter (Signed)
DOS 10/08/2018; 08676 - METATARSAL OSTEOTOMY 2ND AND 19509 - HAMMER TOE REPAIR 2ND W/ PIN RT FOOT  [employer]BS: Effective Date - 03/20/2018 - 03/20/2019   In-Network    Max Per Benefit Period Year-to-Date Remaining  CoInsurance     Deductible  $1,750.00 870.00  Out-Of-Pocket  $5,000.00 4,120.00   In-Network Individual  [place] Coinsurance Authorization Required  Not Applicable  32%  Yes

## 2018-10-02 DIAGNOSIS — M79676 Pain in unspecified toe(s): Secondary | ICD-10-CM

## 2018-10-03 NOTE — Telephone Encounter (Signed)
I called [employer] and spoke to Billy Collins.  He informed me that Authorization is not required for 28308 nor 28285.  The call reference number is [id number].  (his name, date, and time)

## 2018-10-08 ENCOUNTER — Encounter: Payer: Self-pay | Admitting: Podiatry

## 2018-10-08 DIAGNOSIS — M216X1 Other acquired deformities of right foot: Secondary | ICD-10-CM | POA: Diagnosis not present

## 2018-10-08 DIAGNOSIS — M2041 Other hammer toe(s) (acquired), right foot: Secondary | ICD-10-CM | POA: Diagnosis not present

## 2018-10-08 DIAGNOSIS — I10 Essential (primary) hypertension: Secondary | ICD-10-CM | POA: Diagnosis not present

## 2018-10-08 DIAGNOSIS — M21541 Acquired clubfoot, right foot: Secondary | ICD-10-CM | POA: Diagnosis not present

## 2018-10-15 ENCOUNTER — Other Ambulatory Visit: Payer: BC Managed Care – PPO

## 2018-10-16 ENCOUNTER — Encounter: Payer: Self-pay | Admitting: Podiatry

## 2018-10-16 ENCOUNTER — Ambulatory Visit (INDEPENDENT_AMBULATORY_CARE_PROVIDER_SITE_OTHER): Payer: BC Managed Care – PPO | Admitting: Podiatry

## 2018-10-16 ENCOUNTER — Ambulatory Visit (INDEPENDENT_AMBULATORY_CARE_PROVIDER_SITE_OTHER): Payer: BC Managed Care – PPO

## 2018-10-16 ENCOUNTER — Other Ambulatory Visit: Payer: Self-pay

## 2018-10-16 VITALS — Temp 98.1°F

## 2018-10-16 DIAGNOSIS — M2042 Other hammer toe(s) (acquired), left foot: Secondary | ICD-10-CM

## 2018-10-16 DIAGNOSIS — M2041 Other hammer toe(s) (acquired), right foot: Secondary | ICD-10-CM

## 2018-10-16 DIAGNOSIS — Z09 Encounter for follow-up examination after completed treatment for conditions other than malignant neoplasm: Secondary | ICD-10-CM

## 2018-10-16 NOTE — Progress Notes (Signed)
Subjective:   Patient ID: Billy Collins, male   DOB: 58 y.o.   MRN: [id number]   HPI Patient states doing great with the right foot very pleased with minimal discomfort and the pin did come out slightly and he pushed it back in   ROS      Objective:  Physical Exam  Neurovascular status intact negative Homans sign noted with patient second digit in good alignment metatarsal good alignment wound edges well coapted with excellent reduction of the elevation of the second digit     Assessment:  Doing well post digital fusion metatarsal a osteotomy second right     Plan:  H&P condition reviewed and at this time sterile dressing reapplied lowering the second toe advised on continued watching being careful with the second digit and dispensed surgical shoe to begin gradually wearing along with the boot.  Reappoint in the next 2 to 3 weeks or earlier if any issues were to occur  X-ray indicates that the pin and screw are in place with good alignment noted

## 2018-10-17 DIAGNOSIS — M79676 Pain in unspecified toe(s): Secondary | ICD-10-CM

## 2018-10-23 ENCOUNTER — Encounter: Payer: Self-pay | Admitting: Podiatry

## 2018-11-06 ENCOUNTER — Other Ambulatory Visit: Payer: BC Managed Care – PPO

## 2018-11-13 ENCOUNTER — Ambulatory Visit (INDEPENDENT_AMBULATORY_CARE_PROVIDER_SITE_OTHER): Payer: BC Managed Care – PPO

## 2018-11-13 ENCOUNTER — Other Ambulatory Visit: Payer: Self-pay

## 2018-11-13 ENCOUNTER — Ambulatory Visit (INDEPENDENT_AMBULATORY_CARE_PROVIDER_SITE_OTHER): Payer: BC Managed Care – PPO | Admitting: Podiatry

## 2018-11-13 ENCOUNTER — Encounter: Payer: Self-pay | Admitting: Podiatry

## 2018-11-13 DIAGNOSIS — Z09 Encounter for follow-up examination after completed treatment for conditions other than malignant neoplasm: Secondary | ICD-10-CM

## 2018-11-13 DIAGNOSIS — M2042 Other hammer toe(s) (acquired), left foot: Secondary | ICD-10-CM

## 2018-11-13 DIAGNOSIS — M2041 Other hammer toe(s) (acquired), right foot: Secondary | ICD-10-CM

## 2018-11-16 NOTE — Progress Notes (Signed)
   Subjective:  Patient presents today status post hammertoe arthrodesis right 2nd toe. DOS: 10/08/2018. He states he is doing well. He expresses concern that the pin is coming out of the toe. He has been using the post op shoe as directed. He denies any worsening factors at this time. Patient is here for further evaluation and treatment.    History reviewed. No pertinent past medical history.    Objective/Physical Exam Neurovascular status intact.  Skin incisions appear to be well coapted. No sign of infectious process noted. No dehiscence. No active bleeding noted. Moderate edema noted to the surgical extremity.  Radiographic Exam:  Orthopedic hardware and osteotomies sites appear to be stable with routine healing.  Assessment: 1. s/p hammertoe arthrodesis right 2nd toe. DOS: 10/08/2018   Plan of Care:  1. Patient was evaluated. X-rays reviewed 2. Percutaneous in removed.  3. Transition from post op shoe into good sneakers.  4. Return to clinic in 4 weeks.    Edrick Kins, DPM [facility]  Dr. Edrick Kins, [address]                                        [place], [place] [address]                Office [phone]  Fax [phone]

## 2018-12-18 ENCOUNTER — Ambulatory Visit (INDEPENDENT_AMBULATORY_CARE_PROVIDER_SITE_OTHER): Payer: BC Managed Care – PPO

## 2018-12-18 ENCOUNTER — Encounter: Payer: Self-pay | Admitting: Podiatry

## 2018-12-18 ENCOUNTER — Encounter: Payer: BC Managed Care – PPO | Admitting: Podiatry

## 2018-12-18 ENCOUNTER — Other Ambulatory Visit: Payer: Self-pay

## 2018-12-18 ENCOUNTER — Ambulatory Visit (INDEPENDENT_AMBULATORY_CARE_PROVIDER_SITE_OTHER): Payer: BC Managed Care – PPO | Admitting: Podiatry

## 2018-12-18 DIAGNOSIS — M2041 Other hammer toe(s) (acquired), right foot: Secondary | ICD-10-CM

## 2018-12-18 DIAGNOSIS — M2042 Other hammer toe(s) (acquired), left foot: Secondary | ICD-10-CM

## 2018-12-18 NOTE — Progress Notes (Signed)
Subjective:   Patient ID: Billy Collins, male   DOB: 58 y.o.   MRN: [id number]   HPI Patient states doing real well with surgery and very pleased so far and back into soft shoes   ROS      Objective:  Physical Exam  Neurovascular status intact negative Homans sign noted with patient's right foot doing well with wound edges well coapted good alignment of the second toe mild edema still noted     Assessment:  Doing very well post digital fusion shortening osteotomy     Plan:  Final x-rays reviewed allowed patient to return to normal activity shoe gear and will be seen back on an as-needed basis  X-rays indicate screws in place digit in good alignment no signs of pathology

## 2019-02-28 DIAGNOSIS — E7800 Pure hypercholesterolemia, unspecified: Secondary | ICD-10-CM | POA: Diagnosis not present

## 2019-02-28 DIAGNOSIS — I10 Essential (primary) hypertension: Secondary | ICD-10-CM | POA: Diagnosis not present

## 2019-02-28 DIAGNOSIS — Z7189 Other specified counseling: Secondary | ICD-10-CM | POA: Diagnosis not present

## 2019-02-28 DIAGNOSIS — Z23 Encounter for immunization: Secondary | ICD-10-CM | POA: Diagnosis not present

## 2019-02-28 DIAGNOSIS — Z79899 Other long term (current) drug therapy: Secondary | ICD-10-CM | POA: Diagnosis not present

## 2019-09-03 DIAGNOSIS — K429 Umbilical hernia without obstruction or gangrene: Secondary | ICD-10-CM | POA: Diagnosis not present

## 2019-09-03 DIAGNOSIS — Z0000 Encounter for general adult medical examination without abnormal findings: Secondary | ICD-10-CM | POA: Diagnosis not present

## 2019-09-03 DIAGNOSIS — Z23 Encounter for immunization: Secondary | ICD-10-CM | POA: Diagnosis not present

## 2019-09-03 DIAGNOSIS — F1010 Alcohol abuse, uncomplicated: Secondary | ICD-10-CM | POA: Diagnosis not present

## 2019-09-03 DIAGNOSIS — I10 Essential (primary) hypertension: Secondary | ICD-10-CM | POA: Diagnosis not present

## 2019-09-03 DIAGNOSIS — Z125 Encounter for screening for malignant neoplasm of prostate: Secondary | ICD-10-CM | POA: Diagnosis not present

## 2019-09-03 DIAGNOSIS — E7800 Pure hypercholesterolemia, unspecified: Secondary | ICD-10-CM | POA: Diagnosis not present

## 2019-09-30 DIAGNOSIS — K429 Umbilical hernia without obstruction or gangrene: Secondary | ICD-10-CM | POA: Diagnosis not present

## 2019-11-07 DIAGNOSIS — K429 Umbilical hernia without obstruction or gangrene: Secondary | ICD-10-CM | POA: Diagnosis not present

## 2020-05-17 ENCOUNTER — Encounter: Payer: Self-pay | Admitting: Cardiovascular Disease

## 2021-08-26 DIAGNOSIS — Z86010 Personal history of colonic polyps: Secondary | ICD-10-CM | POA: Diagnosis not present

## 2021-09-06 DIAGNOSIS — Z0000 Encounter for general adult medical examination without abnormal findings: Secondary | ICD-10-CM | POA: Diagnosis not present

## 2021-09-06 DIAGNOSIS — I10 Essential (primary) hypertension: Secondary | ICD-10-CM | POA: Diagnosis not present

## 2021-09-06 DIAGNOSIS — E7800 Pure hypercholesterolemia, unspecified: Secondary | ICD-10-CM | POA: Diagnosis not present

## 2021-09-06 DIAGNOSIS — Z125 Encounter for screening for malignant neoplasm of prostate: Secondary | ICD-10-CM | POA: Diagnosis not present

## 2021-11-02 DIAGNOSIS — Z86010 Personal history of colonic polyps: Secondary | ICD-10-CM | POA: Diagnosis not present

## 2021-11-02 DIAGNOSIS — K5730 Diverticulosis of large intestine without perforation or abscess without bleeding: Secondary | ICD-10-CM | POA: Diagnosis not present

## 2021-11-02 DIAGNOSIS — D123 Benign neoplasm of transverse colon: Secondary | ICD-10-CM | POA: Diagnosis not present

## 2021-11-04 DIAGNOSIS — D123 Benign neoplasm of transverse colon: Secondary | ICD-10-CM | POA: Diagnosis not present

## 2021-11-07 DIAGNOSIS — L720 Epidermal cyst: Secondary | ICD-10-CM | POA: Diagnosis not present

## 2021-11-07 DIAGNOSIS — L814 Other melanin hyperpigmentation: Secondary | ICD-10-CM | POA: Diagnosis not present

## 2021-11-07 DIAGNOSIS — L821 Other seborrheic keratosis: Secondary | ICD-10-CM | POA: Diagnosis not present

## 2021-11-07 DIAGNOSIS — D2271 Melanocytic nevi of right lower limb, including hip: Secondary | ICD-10-CM | POA: Diagnosis not present

## 2021-11-07 DIAGNOSIS — L738 Other specified follicular disorders: Secondary | ICD-10-CM | POA: Diagnosis not present

## 2021-11-07 DIAGNOSIS — D225 Melanocytic nevi of trunk: Secondary | ICD-10-CM | POA: Diagnosis not present

## 2022-02-28 DIAGNOSIS — E669 Obesity, unspecified: Secondary | ICD-10-CM | POA: Diagnosis not present

## 2022-02-28 DIAGNOSIS — R69 Illness, unspecified: Secondary | ICD-10-CM | POA: Diagnosis not present

## 2022-02-28 DIAGNOSIS — E7800 Pure hypercholesterolemia, unspecified: Secondary | ICD-10-CM | POA: Diagnosis not present

## 2022-02-28 DIAGNOSIS — I10 Essential (primary) hypertension: Secondary | ICD-10-CM | POA: Diagnosis not present

## 2022-09-06 DIAGNOSIS — Z0000 Encounter for general adult medical examination without abnormal findings: Secondary | ICD-10-CM | POA: Diagnosis not present

## 2022-09-06 DIAGNOSIS — I10 Essential (primary) hypertension: Secondary | ICD-10-CM | POA: Diagnosis not present

## 2022-09-06 DIAGNOSIS — E669 Obesity, unspecified: Secondary | ICD-10-CM | POA: Diagnosis not present

## 2022-09-06 DIAGNOSIS — K219 Gastro-esophageal reflux disease without esophagitis: Secondary | ICD-10-CM | POA: Diagnosis not present

## 2022-09-06 DIAGNOSIS — F1010 Alcohol abuse, uncomplicated: Secondary | ICD-10-CM | POA: Diagnosis not present

## 2022-09-06 DIAGNOSIS — L0291 Cutaneous abscess, unspecified: Secondary | ICD-10-CM | POA: Diagnosis not present

## 2022-09-06 DIAGNOSIS — Z125 Encounter for screening for malignant neoplasm of prostate: Secondary | ICD-10-CM | POA: Diagnosis not present

## 2022-09-06 DIAGNOSIS — E7800 Pure hypercholesterolemia, unspecified: Secondary | ICD-10-CM | POA: Diagnosis not present

## 2023-03-08 DIAGNOSIS — I10 Essential (primary) hypertension: Secondary | ICD-10-CM | POA: Diagnosis not present

## 2023-03-08 DIAGNOSIS — E7800 Pure hypercholesterolemia, unspecified: Secondary | ICD-10-CM | POA: Diagnosis not present

## 2023-09-18 DIAGNOSIS — I10 Essential (primary) hypertension: Secondary | ICD-10-CM | POA: Diagnosis not present

## 2023-09-18 DIAGNOSIS — Z125 Encounter for screening for malignant neoplasm of prostate: Secondary | ICD-10-CM | POA: Diagnosis not present

## 2023-09-18 DIAGNOSIS — Z0000 Encounter for general adult medical examination without abnormal findings: Secondary | ICD-10-CM | POA: Diagnosis not present

## 2023-09-18 DIAGNOSIS — Z79899 Other long term (current) drug therapy: Secondary | ICD-10-CM | POA: Diagnosis not present

## 2023-09-18 DIAGNOSIS — E7800 Pure hypercholesterolemia, unspecified: Secondary | ICD-10-CM | POA: Diagnosis not present

## 2023-09-27 DIAGNOSIS — L814 Other melanin hyperpigmentation: Secondary | ICD-10-CM | POA: Diagnosis not present

## 2023-09-27 DIAGNOSIS — C4441 Basal cell carcinoma of skin of scalp and neck: Secondary | ICD-10-CM | POA: Diagnosis not present

## 2023-09-27 DIAGNOSIS — D225 Melanocytic nevi of trunk: Secondary | ICD-10-CM | POA: Diagnosis not present

## 2023-09-27 DIAGNOSIS — L720 Epidermal cyst: Secondary | ICD-10-CM | POA: Diagnosis not present

## 2023-09-27 DIAGNOSIS — D1801 Hemangioma of skin and subcutaneous tissue: Secondary | ICD-10-CM | POA: Diagnosis not present

## 2023-09-27 DIAGNOSIS — L821 Other seborrheic keratosis: Secondary | ICD-10-CM | POA: Diagnosis not present

## 2023-10-02 DIAGNOSIS — L720 Epidermal cyst: Secondary | ICD-10-CM | POA: Diagnosis not present

## 2023-10-31 DIAGNOSIS — C4441 Basal cell carcinoma of skin of scalp and neck: Secondary | ICD-10-CM | POA: Diagnosis not present

## 2023-12-14 DIAGNOSIS — E538 Deficiency of other specified B group vitamins: Secondary | ICD-10-CM | POA: Diagnosis not present
